# Patient Record
Sex: Female | Born: 1960 | Race: White | Hispanic: No | Marital: Married | Smoking: Never smoker
Health system: Southern US, Community
[De-identification: ages and names within clinical notes are randomized; demographics above are authoritative.]

## PROBLEM LIST (undated history)

## (undated) DIAGNOSIS — K5909 Other constipation: Secondary | ICD-10-CM

## (undated) HISTORY — PX: CLEFT PALATE REPAIR: SUR1165

## (undated) HISTORY — PX: COSMETIC SURGERY: SHX468

## (undated) HISTORY — DX: Other constipation: K59.09

---

## 2001-02-19 ENCOUNTER — Encounter: Admission: RE | Admit: 2001-02-19 | Discharge: 2001-02-19 | Payer: Self-pay | Admitting: Gastroenterology

## 2001-02-19 ENCOUNTER — Encounter: Payer: Self-pay | Admitting: Gastroenterology

## 2001-02-27 ENCOUNTER — Ambulatory Visit (HOSPITAL_COMMUNITY): Admission: RE | Admit: 2001-02-27 | Discharge: 2001-02-27 | Payer: Self-pay | Admitting: Gastroenterology

## 2001-02-27 ENCOUNTER — Encounter: Payer: Self-pay | Admitting: Gastroenterology

## 2003-08-16 ENCOUNTER — Encounter: Admission: RE | Admit: 2003-08-16 | Discharge: 2003-08-16 | Payer: Self-pay | Admitting: Gastroenterology

## 2003-08-18 ENCOUNTER — Encounter: Admission: RE | Admit: 2003-08-18 | Discharge: 2003-08-18 | Payer: Self-pay | Admitting: Gastroenterology

## 2003-09-28 ENCOUNTER — Ambulatory Visit (HOSPITAL_COMMUNITY): Admission: RE | Admit: 2003-09-28 | Discharge: 2003-09-28 | Payer: Self-pay | Admitting: *Deleted

## 2004-03-02 ENCOUNTER — Ambulatory Visit (HOSPITAL_COMMUNITY): Admission: RE | Admit: 2004-03-02 | Discharge: 2004-03-02 | Payer: Self-pay | Admitting: Gastroenterology

## 2004-07-04 ENCOUNTER — Other Ambulatory Visit: Admission: RE | Admit: 2004-07-04 | Discharge: 2004-07-04 | Payer: Self-pay | Admitting: Obstetrics and Gynecology

## 2004-07-25 ENCOUNTER — Ambulatory Visit (HOSPITAL_COMMUNITY): Admission: RE | Admit: 2004-07-25 | Discharge: 2004-07-25 | Payer: Self-pay | Admitting: Obstetrics and Gynecology

## 2004-11-06 ENCOUNTER — Encounter: Admission: RE | Admit: 2004-11-06 | Discharge: 2004-11-06 | Payer: Self-pay | Admitting: Orthopedic Surgery

## 2004-11-30 ENCOUNTER — Ambulatory Visit (HOSPITAL_COMMUNITY): Admission: RE | Admit: 2004-11-30 | Discharge: 2004-11-30 | Payer: Self-pay | Admitting: Gastroenterology

## 2004-12-07 ENCOUNTER — Encounter (INDEPENDENT_AMBULATORY_CARE_PROVIDER_SITE_OTHER): Payer: Self-pay | Admitting: Specialist

## 2004-12-07 ENCOUNTER — Ambulatory Visit (HOSPITAL_COMMUNITY): Admission: RE | Admit: 2004-12-07 | Discharge: 2004-12-07 | Payer: Self-pay | Admitting: Gastroenterology

## 2005-12-05 ENCOUNTER — Other Ambulatory Visit: Admission: RE | Admit: 2005-12-05 | Discharge: 2005-12-05 | Payer: Self-pay | Admitting: Obstetrics and Gynecology

## 2006-12-19 ENCOUNTER — Ambulatory Visit (HOSPITAL_COMMUNITY): Admission: RE | Admit: 2006-12-19 | Discharge: 2006-12-19 | Payer: Self-pay | Admitting: Family Medicine

## 2007-02-11 ENCOUNTER — Other Ambulatory Visit: Admission: RE | Admit: 2007-02-11 | Discharge: 2007-02-11 | Payer: Self-pay | Admitting: Obstetrics and Gynecology

## 2008-02-27 ENCOUNTER — Other Ambulatory Visit: Admission: RE | Admit: 2008-02-27 | Discharge: 2008-02-27 | Payer: Self-pay | Admitting: Obstetrics and Gynecology

## 2008-03-01 ENCOUNTER — Ambulatory Visit (HOSPITAL_COMMUNITY): Admission: RE | Admit: 2008-03-01 | Discharge: 2008-03-01 | Payer: Self-pay | Admitting: Obstetrics and Gynecology

## 2009-03-10 ENCOUNTER — Ambulatory Visit (HOSPITAL_COMMUNITY): Admission: RE | Admit: 2009-03-10 | Discharge: 2009-03-10 | Payer: Self-pay | Admitting: Obstetrics and Gynecology

## 2010-08-20 ENCOUNTER — Encounter: Payer: Self-pay | Admitting: Obstetrics and Gynecology

## 2010-12-15 NOTE — Op Note (Signed)
Angelica Warren, Angelica Warren                            ACCOUNT NO.:  [id number]   MEDICAL RECORD NO.:  [id number]                   PATIENT TYPE:  AMB   LOCATION:  ENDO                                 FACILITY:  [facility]   PHYSICIAN:  Althea Grimmer. Luther Parody, M.D.            DATE OF BIRTH:  1961/05/16   DATE OF PROCEDURE:  09/28/2003  DATE OF DISCHARGE:  09/28/2003                                 OPERATIVE REPORT   PROCEDURE:  Anorectal manometry.   INDICATIONS:  Constipation and difficult defecation.   DESCRIPTION OF PROCEDURE:  The anorectal manometry exam was performed in  standard fashion.  Initial rectal exam did not reveal any pathology.  The  anorectal catheter was inserted and measurements taken.  On pull-through, at  rest, and with sphincter contraction, sensation compliance and ease of  defecation were measured with the balloon inflated as described below.  The  length of the anal sphincter was from approximately 0.5-4.5 cm or 4 cm total  in length which is normal.  The resting internal sphincter pressure was  maximal at 1 cm from the anal verge at 47.5 mmHg which is on the low side of  normal but still within normal limits.  The maximal voluntary contraction  pressure reflective of the external sphincter was 96.4 mmHg which is again,  on the low side of normal but within normal limits.  The patient maintained  a maximal contraction for 52.6 seconds which was normal.  Ecterograms  demonstrated fairly symmetric pressures in the anal canal with mild decrease  anteriorly at 2-4 cm from the anal verge.  The rectoanal inhibitory reflex  was present.  Her first threshold of sensation was 20 mL in the rectal  balloon which is the lowest border of normality.  Her maximal tolerable  volume was 185 mL which should be 200 mL.  Her rectum is slightly more  compliant than usual which possibly could impede defecation.  This, however,  is hard to correlate with her lack of tolerating a larger volume in  the  rectum.  She was able to expel a 50 mL balloon with minimal traction.   IMPRESSION:  Near normal anorectal manometry with borderline low sphincter  pressures, normal sensation, normal sphincter length, and normal rectoanal  inhibitory reflex.  These do not identify any cause for chronic constipation  in this patient.                                               Althea Grimmer. Luther Parody, M.D.    PJS/MEDQ  D:  10/08/2003  T:  10/10/2003  Job:  [id number]   cc:   Bernette Redbird, M.D.  [address]  [place], [place] [address]  Fax: [phone]

## 2010-12-15 NOTE — Op Note (Signed)
NAME:  Angelica Warren, Angelica Warren                            ACCOUNT NO.:  [id number]   MEDICAL RECORD NO.:  [id number]                   PATIENT TYPE:  AMB   LOCATION:  ENDO                                 FACILITY:  [facility]   PHYSICIAN:  Bernette Redbird, M.D.                DATE OF BIRTH:  1961-05-06   DATE OF PROCEDURE:  03/02/2004  DATE OF DISCHARGE:                                 OPERATIVE REPORT   PROCEDURE PERFORMED:  Colonoscopy.   ENDOSCOPIST:  Florencia Reasons, M.D.   INDICATIONS FOR PROCEDURE:  Constipation and bloating in a 50 year old  female with a history of periodic abdominal pain thought to possibly be of  biliary tract origin.   FINDINGS:  Normal exam to the terminal ileum.   DESCRIPTION OF PROCEDURE:  The nature, purpose and risks of the procedure  had been discussed with the patient, who provided written consent.  Sedation  was fentanyl 60 mcg and Versed 6 mg IV without arrhythmias or desaturation.   The Olympus adjustable tension pediatric video colonoscope was advanced  without significant difficulty, just some looping, to the terminal ileum  which had a normal appearance and pullback was then performed.  The quality  of the prep was excellent and it is felt that all areas were well seen.  This was a normal examination.  No polyps, cancer, colitis, vascular  malformations, diverticular disease or colonic strictures were observed.  Retroflexion could not be accomplished in the rectum due to a small rectal  ampulla but reinspection of the rectum and careful antegrade viewing of the  distal rectum disclosed no abnormalities.  There were perhaps mild to  moderate internal hemorrhoids present but nothing particularly impressive.  No biopsies were obtained.   The patient tolerated the procedure well and there were no apparent  complications.   IMPRESSION:  No source of constipation colonoscopically evident.  Normal  exam [phone]).   PLAN:  Clinical follow-up.                                       Bernette Redbird, M.D.    RB/MEDQ  D:  03/02/2004  T:  03/03/2004  Job:  [id number]   cc:   Duncan Dull, M.D.  [address]  [place]  [place] [address]  Fax: [phone]

## 2010-12-15 NOTE — Op Note (Signed)
[name], Angelica Warren                  ACCOUNT NO.:  [id number]   MEDICAL RECORD NO.:  [id number]          PATIENT TYPE:  AMB   LOCATION:  ENDO                         FACILITY:  [facility]   PHYSICIAN:  Bernette Redbird, M.D.   DATE OF BIRTH:  [birth year]   DATE OF PROCEDURE:  12/07/2004  DATE OF DISCHARGE:                                 OPERATIVE REPORT   PROCEDURE:  Upper endoscopy with biopsies.   ENDOSCOPIST:  Bernette Redbird, M.D.   INDICATIONS:  [age]-year-old female with nonspecific upper abdominal  symptoms, being considered for possible empiric cholecystectomy.  She has  had some degree of symptomatic improvement on empiric ursodeoxycholic acid  therapy.   FINDINGS:  Normal exam.   PROCEDURE:  The nature, purpose and  risks of the procedure been discussed  with the patient, who provided written consent.  Sedation was fentanyl 50  mcg and Versed 6 mg IV without arrhythmias or desaturation.  The Olympus  small-caliber adult video endoscope was passed under direct vision.  The  vocal cords were not well-seen, but no gross laryngeal abnormalities were  appreciated.  The esophagus was entered and was endoscopically normal  without evidence of reflux esophagitis, Barrett's esophagus, varices,  infection, neoplasia, free reflux or any ring, stricture or hiatal hernia.  The stomach contained no significant residual and had normal mucosa without  evidence of gastritis, erosions, ulcers, polyps or masses including a  retroflexed view of the cardia.  The pylorus, duodenal bulb and second  duodenum looked normal.  Random duodenal and antral mucosal biopsies were  obtained prior to removal of the scope.  The patient tolerated the procedure  well and there were no apparent complications.   IMPRESSION:  Upper abdominal pain and nonspecific discomfort without  worrisome findings on current examination.   PLAN:  Await pathology results.      RB/MEDQ  D:  12/07/2004  T:  12/07/2004  Job:   [id number]   cc:   Duncan Dull, M.D.  [address]  [place]  [place] [address]  Fax: [phone]   Mikey Bussing  [address]  [place]  [place] [address]  Fax: [phone]

## 2011-10-29 ENCOUNTER — Other Ambulatory Visit: Payer: Self-pay | Admitting: Obstetrics and Gynecology

## 2011-10-29 DIAGNOSIS — Z1231 Encounter for screening mammogram for malignant neoplasm of breast: Secondary | ICD-10-CM

## 2011-11-01 ENCOUNTER — Ambulatory Visit
Admission: RE | Admit: 2011-11-01 | Discharge: 2011-11-01 | Disposition: A | Discharge status: Home / Self Care | Payer: BC Managed Care – PPO | Source: Ambulatory Visit | Attending: Obstetrics and Gynecology | Admitting: Obstetrics and Gynecology

## 2011-11-01 DIAGNOSIS — Z1231 Encounter for screening mammogram for malignant neoplasm of breast: Secondary | ICD-10-CM

## 2011-11-02 ENCOUNTER — Other Ambulatory Visit: Payer: Self-pay | Admitting: Obstetrics and Gynecology

## 2011-11-02 DIAGNOSIS — R928 Other abnormal and inconclusive findings on diagnostic imaging of breast: Secondary | ICD-10-CM

## 2011-11-16 ENCOUNTER — Other Ambulatory Visit: Payer: BC Managed Care – PPO

## 2011-11-16 ENCOUNTER — Ambulatory Visit
Admission: RE | Admit: 2011-11-16 | Discharge: 2011-11-16 | Disposition: A | Discharge status: Home / Self Care | Payer: BC Managed Care – PPO | Source: Ambulatory Visit | Attending: Obstetrics and Gynecology | Admitting: Obstetrics and Gynecology

## 2011-11-16 DIAGNOSIS — R928 Other abnormal and inconclusive findings on diagnostic imaging of breast: Secondary | ICD-10-CM

## 2012-02-19 LAB — HM PAP SMEAR: HM Pap smear: NEGATIVE

## 2013-02-13 ENCOUNTER — Encounter: Payer: Self-pay | Admitting: *Deleted

## 2013-02-19 ENCOUNTER — Encounter: Payer: Self-pay | Admitting: Nurse Practitioner

## 2013-02-19 ENCOUNTER — Ambulatory Visit (INDEPENDENT_AMBULATORY_CARE_PROVIDER_SITE_OTHER): Payer: BC Managed Care – PPO | Admitting: Nurse Practitioner

## 2013-02-19 ENCOUNTER — Other Ambulatory Visit: Payer: Self-pay

## 2013-02-19 VITALS — BP 106/50 | HR 66 | Resp 14 | Ht 65.75 in | Wt 134.2 lb

## 2013-02-19 DIAGNOSIS — Z01419 Encounter for gynecological examination (general) (routine) without abnormal findings: Secondary | ICD-10-CM

## 2013-02-19 DIAGNOSIS — Z0000 Encounter for general adult medical examination without abnormal findings: Secondary | ICD-10-CM

## 2013-02-19 DIAGNOSIS — E559 Vitamin D deficiency, unspecified: Secondary | ICD-10-CM

## 2013-02-19 DIAGNOSIS — Z1231 Encounter for screening mammogram for malignant neoplasm of breast: Secondary | ICD-10-CM

## 2013-02-19 LAB — POCT URINALYSIS DIPSTICK
Leukocytes, UA: NEGATIVE
Spec Grav, UA: 1.015
Urobilinogen, UA: NEGATIVE
pH, UA: 6

## 2013-02-19 LAB — HEMOGLOBIN, FINGERSTICK: Hemoglobin, fingerstick: 13 g/dL (ref 12.0–16.0)

## 2013-02-19 NOTE — Progress Notes (Signed)
52 y.o. G0P0 Married  Fe here for annual exam.  She got married 10/23/09 and tried for a pregnancy then but already perimenopausal with irregular cycles. After her father died 2010-09-04 her cycles became more irregular.  She took Provera challenge last July and had spotting 5 days light to dark brownish to black.  Then no menses until May 2014 - lasted 3-4 days normal to light flow. Some breast tendreness. Some increase in night flushes that are tolerable this past month. Some increase in vaginal dryness. No vaginal bleeding since first week of May.  Patient's last menstrual period was 12/03/2012.          Sexually active: yes  The current method of family planning is none.    Exercising: no  The patient does not participate in regular exercise at present. Smoker:  no  Health Maintenance: Pap:  02/19/2012  Normal with negative HR HPV MMG:  11/03/2011 scheduled for 02/20/13 Colonoscopy:  12/2012 normal and recheck in 10 years BMD:   never TDaP:  01/2008 Labs: Hgb- 13.0    reports that she has never smoked. She has never used smokeless tobacco. She reports that she does not drink alcohol or use illicit drugs.  Past Medical History  Diagnosis Date  . Chronic constipation     Past Surgical History  Procedure Laterality Date  . Cleft palate repair  infant  . Cosmetic surgery Left     hand    Current Outpatient Prescriptions  Medication Sig Dispense Refill  . Ascorbic Acid (VITAMIN C) 100 MG tablet Take 100 mg by mouth daily.      Marland Kitchen aspirin 325 MG buffered tablet Take 325 mg by mouth daily.      . Digestive Enzymes (SIMILASE PO) Take by mouth.      . fish oil-omega-3 fatty acids 1000 MG capsule Take 2 g by mouth daily.      . milk thistle 175 MG tablet Take 175 mg by mouth daily.      . Multiple Vitamin (MULTIVITAMIN) tablet Take 1 tablet by mouth daily.      Marland Kitchen PROBIOTIC CAPS Take 1 capsule by mouth daily.      . Vitamin D, Ergocalciferol, (DRISDOL) 50000 UNITS CAPS Take 50,000 Units by mouth  every 7 (seven) days.       No current facility-administered medications for this visit.    Family History  Problem Relation Age of Onset  . Throat cancer Father     throat cancer  . Thyroid disease Father   . Hypertension Father   . Hyperlipidemia Father   . Thyroid disease Maternal Aunt   . Osteoporosis Maternal Aunt   . Lung cancer Maternal Uncle     lung cancer  . Heart disease Maternal Uncle   . Lung cancer Paternal Uncle     lung cancer  . Thyroid disease Maternal Grandmother   . Osteoporosis Maternal Grandmother   . Heart disease Maternal Grandfather   . Cancer Paternal Grandmother     ROS:  Pertinent items are noted in HPI.  Otherwise, a comprehensive ROS was negative.  Exam:   BP 106/50  Pulse 66  Resp 14  Ht 5' 5.75" (1.67 m)  Wt 134 lb 3.2 oz (60.873 kg)  BMI 21.83 kg/m2  LMP 12/03/2012 Height: 5' 5.75" (167 cm)  Ht Readings from Last 3 Encounters:  02/19/13 5' 5.75" (1.67 m)    General appearance: alert, cooperative and appears stated age Head: Normocephalic, without obvious abnormality, atraumatic Neck:  no adenopathy, supple, symmetrical, trachea midline and thyroid normal to inspection and palpation Lungs: clear to auscultation bilaterally Breasts: normal appearance, no masses or tenderness Heart: regular rate and rhythm Abdomen: soft, non-tender; no masses,  no organomegaly Extremities: extremities normal, atraumatic, no cyanosis or edema Skin: Skin color, texture, turgor normal. No rashes or lesions Lymph nodes: Cervical, supraclavicular, and axillary nodes normal. No abnormal inguinal nodes palpated Neurologic: Grossly normal   Pelvic: External genitalia:  no lesions              Urethra:  normal appearing urethra with no masses, tenderness or lesions              Bartholin's and Skene's: normal                 Vagina: normal appearing vagina with normal color and discharge, no lesions              Cervix: anteverted              Pap taken:  no Bimanual Exam:  Uterus:  normal size, contour, position, consistency, mobility, non-tender              Adnexa: no mass, fullness, tenderness               Rectovaginal: Confirms               Anus:  normal sphincter tone, no lesions  A:  Well Woman with normal exam  Perimenopausal with irregular menses  Vit D deficiency  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  Will repeat Provera challenge in late August if amenorrhea then follow with [facility] if indicated. return annually or prn  An After Visit Summary was printed and given to the patient.

## 2013-02-19 NOTE — Patient Instructions (Addendum)

## 2013-02-20 ENCOUNTER — Ambulatory Visit (HOSPITAL_COMMUNITY)
Admission: RE | Admit: 2013-02-20 | Discharge: 2013-02-20 | Disposition: A | Discharge status: Home / Self Care | Payer: BC Managed Care – PPO | Source: Ambulatory Visit | Attending: Obstetrics and Gynecology | Admitting: Obstetrics and Gynecology

## 2013-02-20 DIAGNOSIS — Z1231 Encounter for screening mammogram for malignant neoplasm of breast: Secondary | ICD-10-CM

## 2013-02-20 LAB — VITAMIN D 25 HYDROXY (VIT D DEFICIENCY, FRACTURES): Vit D, 25-Hydroxy: 66 ng/mL (ref 30–89)

## 2013-02-22 NOTE — Progress Notes (Signed)
Encounter reviewed by Dr. Brook Silva.  

## 2013-02-24 ENCOUNTER — Telehealth: Payer: Self-pay | Admitting: *Deleted

## 2013-02-24 NOTE — Telephone Encounter (Signed)
Message copied by Osie Bond on Tue Feb 24, 2013  2:23 PM ------      Message from: Ria Comment R      Created: Mon Feb 23, 2013  6:44 PM       Follow protocol ------

## 2013-02-24 NOTE — Telephone Encounter (Signed)
Pt is aware of Vitamin D lab results and is agreeable to begin Vitamin D (otc) 600-800 IU  1 po qd.

## 2013-03-06 ENCOUNTER — Ambulatory Visit: Payer: BC Managed Care – PPO

## 2014-02-22 ENCOUNTER — Encounter: Payer: Self-pay | Admitting: Nurse Practitioner

## 2014-02-22 ENCOUNTER — Ambulatory Visit (INDEPENDENT_AMBULATORY_CARE_PROVIDER_SITE_OTHER): Payer: BC Managed Care – PPO | Admitting: Nurse Practitioner

## 2014-02-22 VITALS — BP 116/60 | HR 60 | Ht 65.75 in | Wt 135.0 lb

## 2014-02-22 DIAGNOSIS — Z01419 Encounter for gynecological examination (general) (routine) without abnormal findings: Secondary | ICD-10-CM

## 2014-02-22 DIAGNOSIS — N951 Menopausal and female climacteric states: Secondary | ICD-10-CM

## 2014-02-22 DIAGNOSIS — Z0000 Encounter for general adult medical examination without abnormal findings: Secondary | ICD-10-CM

## 2014-02-22 DIAGNOSIS — R319 Hematuria, unspecified: Secondary | ICD-10-CM

## 2014-02-22 LAB — POCT URINALYSIS DIPSTICK
Bilirubin, UA: NEGATIVE
Glucose, UA: NEGATIVE
Ketones, UA: NEGATIVE
Leukocytes, UA: NEGATIVE
Nitrite, UA: NEGATIVE
Protein, UA: NEGATIVE
Urobilinogen, UA: NEGATIVE
pH, UA: 6

## 2014-02-22 LAB — HEMOGLOBIN, FINGERSTICK: Hemoglobin, fingerstick: 12.1 g/dL (ref 12.0–16.0)

## 2014-02-22 NOTE — Progress Notes (Signed)
53 y.o. G0P0 Married Caucasian Fe here for annual exam.  No vaginal bleeding since October 2014.  Did not take Provera challenge.  But also did not have usual menstrual symptoms prior to starting.  3-4 days moderate to light. PMP 11/2012 without Provera challenge.  No Provera since July 2013.  She is having an increase in vaso symptoms.  Patient's last menstrual period was 04/29/2013.          Sexually active: Yes.    The current method of family planning is none.    Exercising: No.  The patient does not participate in regular exercise at present. Smoker:  no  Health Maintenance: Pap:  02/19/12 Normal with Negative HR HPV MMG:  02/23/13 BI-RADS Neg Colonoscopy: 6/14 normal and recheck in 10 years  TDaP:  01/2008  Labs: Hgb: 12.1   Urine: RBC trace   reports that she has never smoked. She has never used smokeless tobacco. She reports that she does not drink alcohol or use illicit drugs.  Past Medical History  Diagnosis Date  . Chronic constipation     Past Surgical History  Procedure Laterality Date  . Cleft palate repair  infant  . Cosmetic surgery Left     hand    Current Outpatient Prescriptions  Medication Sig Dispense Refill  . Ascorbic Acid (VITAMIN C) 100 MG tablet Take 100 mg by mouth daily.      Marland Kitchen aspirin 325 MG buffered tablet Take 325 mg by mouth daily.      . Digestive Enzymes (SIMILASE PO) Take by mouth.      . fish oil-omega-3 fatty acids 1000 MG capsule Take 2 g by mouth daily.      . milk thistle 175 MG tablet Take 175 mg by mouth daily.      . Misc Natural Products (SUPER GREENS) POWD Take by mouth.      . Multiple Vitamin (MULTIVITAMIN) tablet Take 1 tablet by mouth daily.      Marland Kitchen PROBIOTIC CAPS Take 1 capsule by mouth daily.      . Vitamin D, Ergocalciferol, (DRISDOL) 50000 UNITS CAPS Take 50,000 Units by mouth every 7 (seven) days.       No current facility-administered medications for this visit.    Family History  Problem Relation Age of Onset  . Throat  cancer Father     throat cancer  . Thyroid disease Father   . Hypertension Father   . Hyperlipidemia Father   . Thyroid disease Maternal Aunt   . Osteoporosis Maternal Aunt   . Lung cancer Maternal Uncle     lung cancer  . Heart disease Maternal Uncle   . Lung cancer Paternal Uncle     lung cancer  . Thyroid disease Maternal Grandmother   . Osteoporosis Maternal Grandmother   . Heart disease Maternal Grandfather   . Cancer Paternal Grandmother     ROS:  Pertinent items are noted in HPI.  Otherwise, a comprehensive ROS was negative.  Exam:   BP 116/60  Pulse 60  Ht 5' 5.75" (1.67 m)  Wt 135 lb (61.236 kg)  BMI 21.96 kg/m2  LMP 04/29/2013 Height: 5' 5.75" (167 cm)  Ht Readings from Last 3 Encounters:  02/22/14 5' 5.75" (1.67 m)  02/19/13 5' 5.75" (1.67 m)    General appearance: alert, cooperative and appears stated age Head: Normocephalic, without obvious abnormality, atraumatic Neck: no adenopathy, supple, symmetrical, trachea midline and thyroid normal to inspection and palpation Lungs: clear to auscultation bilaterally Breasts:  normal appearance, no masses or tenderness Heart: regular rate and rhythm Abdomen: soft, non-tender; no masses,  no organomegaly Extremities: extremities normal, atraumatic, no cyanosis or edema Skin: Skin color, texture, turgor normal. No rashes or lesions Lymph nodes: Cervical, supraclavicular, and axillary nodes normal. No abnormal inguinal nodes palpated Neurologic: Grossly normal   Pelvic: External genitalia:  no lesions              Urethra:  normal appearing urethra with no masses, tenderness or lesions              Bartholin's and Skene's: normal                 Vagina: normal appearing vagina with normal color and discharge, no lesions              Cervix: anteverted              Pap taken: No. Bimanual Exam:  Uterus:  normal size, contour, position, consistency, mobility, non-tender              Adnexa: no mass, fullness,  tenderness               Rectovaginal: Confirms               Anus:  normal sphincter tone, no lesions  A:  Well Woman with normal exam  Perimenopausal  Increase in vaso symptoms.  R/O UTI  P:   Reviewed health and wellness pertinent to exam  Pap smear not taken today  Mammogram is due 7/15  Follow with urine culture and micro  Follow with future labs  If she does not do well with natural products for vaso symptoms may need HRT - she will need to return for consult visit - she is in agreement with plan.  Counseled on breast self exam, mammography screening, adequate intake of calcium and vitamin D, diet and exercise, Kegel's exercises return annually or prn  An After Visit Summary was printed and given to the patient.

## 2014-02-22 NOTE — Patient Instructions (Signed)

## 2014-02-23 ENCOUNTER — Other Ambulatory Visit: Payer: Self-pay | Admitting: Nurse Practitioner

## 2014-02-23 DIAGNOSIS — Z1231 Encounter for screening mammogram for malignant neoplasm of breast: Secondary | ICD-10-CM

## 2014-02-23 LAB — URINE CULTURE
Colony Count: NO GROWTH
Organism ID, Bacteria: NO GROWTH

## 2014-02-23 LAB — URINALYSIS, MICROSCOPIC ONLY
Bacteria, UA: NONE SEEN
Casts: NONE SEEN
Crystals: NONE SEEN
Squamous Epithelial / LPF: NONE SEEN

## 2014-02-23 LAB — FOLLICLE STIMULATING HORMONE: FSH: 99.4 m[IU]/mL

## 2014-02-24 ENCOUNTER — Ambulatory Visit (HOSPITAL_COMMUNITY)
Admission: RE | Admit: 2014-02-24 | Discharge: 2014-02-24 | Disposition: A | Discharge status: Home / Self Care | Payer: BC Managed Care – PPO | Source: Ambulatory Visit | Attending: Nurse Practitioner | Admitting: Nurse Practitioner

## 2014-02-24 DIAGNOSIS — Z1231 Encounter for screening mammogram for malignant neoplasm of breast: Secondary | ICD-10-CM | POA: Insufficient documentation

## 2014-02-25 ENCOUNTER — Other Ambulatory Visit (INDEPENDENT_AMBULATORY_CARE_PROVIDER_SITE_OTHER): Payer: BC Managed Care – PPO

## 2014-02-25 ENCOUNTER — Telehealth: Payer: Self-pay | Admitting: *Deleted

## 2014-02-25 DIAGNOSIS — Z0000 Encounter for general adult medical examination without abnormal findings: Secondary | ICD-10-CM

## 2014-02-25 LAB — LIPID PANEL
Cholesterol: 169 mg/dL (ref 0–200)
HDL: 76 mg/dL (ref >39)
LDL Cholesterol: 81 mg/dL (ref 0–99)
Total CHOL/HDL Ratio: 2.2 Ratio
Triglycerides: 60 mg/dL (ref <150)
VLDL: 12 mg/dL (ref 0–40)

## 2014-02-25 LAB — COMPREHENSIVE METABOLIC PANEL
ALT: 18 U/L (ref 0–35)
AST: 23 U/L (ref 0–37)
Albumin: 4.2 g/dL (ref 3.5–5.2)
Alkaline Phosphatase: 69 U/L (ref 39–117)
BUN: 15 mg/dL (ref 6–23)
CO2: 28 mEq/L (ref 19–32)
Calcium: 9.5 mg/dL (ref 8.4–10.5)
Chloride: 104 mEq/L (ref 96–112)
Creat: 0.77 mg/dL (ref 0.50–1.10)
Glucose, Bld: 84 mg/dL (ref 70–99)
Potassium: 4.3 mEq/L (ref 3.5–5.3)
Sodium: 141 mEq/L (ref 135–145)
Total Bilirubin: 0.5 mg/dL (ref 0.2–1.2)
Total Protein: 6.8 g/dL (ref 6.0–8.3)

## 2014-02-25 NOTE — Telephone Encounter (Signed)
Pt notified of results.  Pt voices understanding and is agreeable with plan.  Pt had labs drawn earlier today.  Advised those results not available, but we will call her when results are available.

## 2014-02-25 NOTE — Telephone Encounter (Signed)
Message copied by Graylon Good on Thu Feb 25, 2014  3:42 PM ------      Message from: GRUBB, PATRICIA R      Created: Wed Feb 24, 2014  6:15 PM       Let patient know that urine micro and culture is negative. Her [place] does show menopause.  Have her to try OTC herbal products for vaso symptoms - if not helpful may need HRT. ------

## 2014-02-25 NOTE — Progress Notes (Signed)
Encounter reviewed by Dr. Brook Silva.  

## 2014-02-25 NOTE — Telephone Encounter (Signed)
I have attempted to contact this patient by phone with the following results: I will continue to try later.  Pt unavailable and mailbox full. [phone] (Mobile)

## 2014-02-26 LAB — VITAMIN D 25 HYDROXY (VIT D DEFICIENCY, FRACTURES): Vit D, 25-Hydroxy: 63 ng/mL (ref 30–89)

## 2014-02-26 LAB — TSH: TSH: 0.75 u[IU]/mL (ref 0.350–4.500)

## 2014-11-11 ENCOUNTER — Encounter: Payer: Self-pay | Admitting: Nurse Practitioner

## 2014-11-11 ENCOUNTER — Telehealth: Payer: Self-pay | Admitting: Nurse Practitioner

## 2014-11-11 NOTE — Telephone Encounter (Signed)
I was unable to reach patient by phone, mailed letter.

## 2015-02-24 ENCOUNTER — Ambulatory Visit: Payer: BC Managed Care – PPO | Admitting: Nurse Practitioner

## 2015-02-25 ENCOUNTER — Encounter: Payer: Self-pay | Admitting: Nurse Practitioner

## 2015-02-25 ENCOUNTER — Ambulatory Visit (INDEPENDENT_AMBULATORY_CARE_PROVIDER_SITE_OTHER): Payer: BC Managed Care – PPO | Admitting: Nurse Practitioner

## 2015-02-25 VITALS — BP 104/60 | HR 72 | Ht 65.5 in | Wt 134.4 lb

## 2015-02-25 DIAGNOSIS — R8290 Unspecified abnormal findings in urine: Secondary | ICD-10-CM | POA: Diagnosis not present

## 2015-02-25 DIAGNOSIS — Z0000 Encounter for general adult medical examination without abnormal findings: Secondary | ICD-10-CM | POA: Diagnosis not present

## 2015-02-25 DIAGNOSIS — Z01419 Encounter for gynecological examination (general) (routine) without abnormal findings: Secondary | ICD-10-CM

## 2015-02-25 DIAGNOSIS — N950 Postmenopausal bleeding: Secondary | ICD-10-CM | POA: Diagnosis not present

## 2015-02-25 DIAGNOSIS — N912 Amenorrhea, unspecified: Secondary | ICD-10-CM | POA: Diagnosis not present

## 2015-02-25 LAB — POCT URINALYSIS DIPSTICK
Leukocytes, UA: NEGATIVE
Urobilinogen, UA: NEGATIVE
pH, UA: 5

## 2015-02-25 LAB — TSH: TSH: 0.858 u[IU]/mL (ref 0.350–4.500)

## 2015-02-25 NOTE — Patient Instructions (Signed)

## 2015-02-25 NOTE — Progress Notes (Signed)
54 y.o. G0P0 Married  Caucasian Fe here for annual exam.  PMP 04/29/2013.  She then had another light 2 day spotting 11/28/2014.  She was unsure how long between the last menses.  Per last year history it has been over 1 year.  No associated PMS, vaso symptoms are tolerable.  The last [facility] 01/2014 was 99.4.  She has had no other PMS symptoms, bloating, or breast tenderness. She does have an increase in vaginal dryness. She is also having an increase in rectocele problems especially with SA. Stool in the vault is causing increase pain with insertion into the vagina.  Patient's last menstrual period was 11/28/2014.          Sexually active: Yes.    The current method of family planning is post menopausal status.    Exercising: No.  The patient does not participate in regular exercise at present. Smoker:  no  Health Maintenance: Pap:  02/19/12 wnl MMG:  02/24/14 bi-rads 1: negative Colonoscopy:  12/2012 normal f/u 2024 BMD:   Never had one TDaP:  2009 Labs: Hgb:     ; Urine: Trace rbc's - no symptoms   reports that she has never smoked. She has never used smokeless tobacco. She reports that she does not drink alcohol or use illicit drugs.  Past Medical History  Diagnosis Date  . Chronic constipation     Past Surgical History  Procedure Laterality Date  . Cleft palate repair  infant  . Cosmetic surgery Left     hand    Current Outpatient Prescriptions  Medication Sig Dispense Refill  . Ascorbic Acid (VITAMIN C) 100 MG tablet Take 100 mg by mouth daily.    Marland Kitchen aspirin 325 MG buffered tablet Take 325 mg by mouth as needed.     . B Complex Vitamins (VITAMIN B COMPLEX PO) Take 1 capsule by mouth daily.    . Calcium-Magnesium-Vitamin D (CALCIUM MAGNESIUM PO) Take by mouth. 1-2 caps daily    . Digestive Enzymes (SIMILASE PO) Take by mouth.    . fish oil-omega-3 fatty acids 1000 MG capsule Take 2 g by mouth daily.    . Misc Natural Products (SUPER GREENS) POWD Take by mouth.    . Multiple Vitamin  (MULTIVITAMIN) tablet Take 1 tablet by mouth daily.    Marland Kitchen OVER THE COUNTER MEDICATION daily. Thyroid Support 1-2 capsules    . OVER THE COUNTER MEDICATION daily. Adrenal Support 1-2 caps    . OVER THE COUNTER MEDICATION daily. Liver Support 1-2 capsules    . PROBIOTIC CAPS Take 1 capsule by mouth daily.    . Vitamin D, Ergocalciferol, (DRISDOL) 50000 UNITS CAPS Take 50,000 Units by mouth every 7 (seven) days.     No current facility-administered medications for this visit.    Family History  Problem Relation Age of Onset  . Throat cancer Father     throat cancer  . Thyroid disease Father   . Hypertension Father   . Hyperlipidemia Father   . Thyroid disease Maternal Aunt   . Osteoporosis Maternal Aunt   . Lung cancer Maternal Uncle     lung cancer  . Heart disease Maternal Uncle   . Lung cancer Paternal Uncle     lung cancer  . Thyroid disease Maternal Grandmother   . Osteoporosis Maternal Grandmother   . Heart disease Maternal Grandfather   . Cancer Paternal Grandmother     ROS:  Pertinent items are noted in HPI.  Otherwise, a comprehensive ROS was  negative.  Exam:   BP 104/60 mmHg  Pulse 72  Ht 5' 5.5" (1.664 m)  Wt 134 lb 6.4 oz (60.963 kg)  BMI 22.02 kg/m2  LMP 11/28/2014 Height: 5' 5.5" (166.4 cm) Ht Readings from Last 3 Encounters:  02/25/15 5' 5.5" (1.664 m)  02/22/14 5' 5.75" (1.67 m)  02/19/13 5' 5.75" (1.67 m)    General appearance: alert, cooperative and appears stated age Head: Normocephalic, without obvious abnormality, atraumatic Neck: no adenopathy, supple, symmetrical, trachea midline and thyroid normal to inspection and palpation Lungs: clear to auscultation bilaterally Breasts: normal appearance, no masses or tenderness Heart: regular rate and rhythm Abdomen: soft, non-tender; no masses,  no organomegaly Extremities: extremities normal, atraumatic, no cyanosis or edema Skin: Skin color, texture, turgor normal. No rashes or lesions Lymph nodes:  Cervical, supraclavicular, and axillary nodes normal. No abnormal inguinal nodes palpated Neurologic: Grossly normal   Pelvic: External genitalia:  no lesions              Urethra:  normal appearing urethra with no masses, tenderness or lesions              Bartholin's and Skene's: normal                 Vagina: normal appearing vagina with normal color and discharge, no lesions              Cervix: anteverted              Pap taken: Yes.   Bimanual Exam:  Uterus:  normal size, contour, position, consistency, mobility, non-tender              Adnexa: no mass, fullness, tenderness               Rectovaginal: Confirms rectocele without stool in the vault               Anus:  normal sphincter tone, no lesions  Chaperone present: Yes  A:  Well Woman with normal exam  Perimenopausal vs. menopausal Increase in vaso symptoms. R/O UTI  PMB 11/2014  Rectocele   P:   Reviewed health and wellness pertinent to exam  Pap smear as above  Mammogram is due now and is scheduled  Will get PUS and endo biopsy if needed and follow, may re look at rectocele issues later and see if any surgical treatment is needed.  Will follow with labs and urine  Counseled on breast self exam, mammography screening, adequate intake of calcium and vitamin D, diet and exercise return annually or prn  An After Visit Summary was printed and given to the patient.

## 2015-02-26 LAB — URINALYSIS, MICROSCOPIC ONLY
Bacteria, UA: NONE SEEN [HPF]
Casts: NONE SEEN [LPF]
Crystals: NONE SEEN [HPF]
RBC / HPF: NONE SEEN RBC/HPF (ref <=2)
Squamous Epithelial / LPF: NONE SEEN [HPF] (ref <=5)
WBC, UA: NONE SEEN WBC/HPF (ref <=5)
Yeast: NONE SEEN [HPF]

## 2015-02-26 LAB — FOLLICLE STIMULATING HORMONE: FSH: 117.5 m[IU]/mL — ABNORMAL HIGH

## 2015-02-26 LAB — URINE CULTURE
Colony Count: NO GROWTH
Organism ID, Bacteria: NO GROWTH

## 2015-02-26 LAB — PROLACTIN: Prolactin: 7.3 ng/mL

## 2015-02-26 NOTE — Progress Notes (Signed)
Encounter reviewed by Dr. Brook Amundson C. Silva.  

## 2015-03-02 ENCOUNTER — Telehealth: Payer: Self-pay | Admitting: Emergency Medicine

## 2015-03-02 DIAGNOSIS — N950 Postmenopausal bleeding: Secondary | ICD-10-CM

## 2015-03-02 LAB — IPS PAP TEST WITH HPV

## 2015-03-02 LAB — HEMOGLOBIN, FINGERSTICK: Hemoglobin, fingerstick: 12.3 g/dL (ref 12.0–16.0)

## 2015-03-02 NOTE — Telephone Encounter (Signed)
Message left to return call to Angelica Warren at [phone].    

## 2015-03-02 NOTE — Telephone Encounter (Signed)
Spoke with patient and message from Angelica Warren, [place] given. Patient is agreeable and had been scheduled for pelvic ultrasound  and Endometrial biopsy with Dr Quincy Simmonds for tomorrow. Routing to provider for final review. Patient agreeable to disposition. Will close encounter.

## 2015-03-02 NOTE — Telephone Encounter (Signed)
-----   Message from Kem Boroughs, [place] sent at 02/28/2015  7:50 AM EDT ----- Please let pt. Know that [facility] was much higher at 117.5 compared to last year at 99.4.  This means this past vaginal bleeding episode was most likely post menopausal bleeding and not because of ovulation.  We will need to proceed as planned with endo biopsy and PUS. When this is scheduled she will need Cytotec.  The remainder of labs with Prolactin, Thyroid, and urine micro and culture were all normal.

## 2015-03-03 ENCOUNTER — Encounter: Payer: Self-pay | Admitting: Obstetrics and Gynecology

## 2015-03-03 ENCOUNTER — Ambulatory Visit (INDEPENDENT_AMBULATORY_CARE_PROVIDER_SITE_OTHER): Payer: BC Managed Care – PPO | Admitting: Obstetrics and Gynecology

## 2015-03-03 ENCOUNTER — Ambulatory Visit (INDEPENDENT_AMBULATORY_CARE_PROVIDER_SITE_OTHER): Payer: BC Managed Care – PPO

## 2015-03-03 VITALS — BP 120/82 | HR 84 | Ht 65.5 in | Wt 134.0 lb

## 2015-03-03 DIAGNOSIS — N950 Postmenopausal bleeding: Secondary | ICD-10-CM

## 2015-03-03 DIAGNOSIS — N812 Incomplete uterovaginal prolapse: Secondary | ICD-10-CM | POA: Diagnosis not present

## 2015-03-03 NOTE — Progress Notes (Signed)
Subjective  54 y.o. G56P0  Caucasian female here for pelvic ultrasound for ultrasound for Postmenopausal bleeding.  LMP was 04/29/13.  Had bleeding 11/28/14 that was light spotting.  Seen for annual exam and had [place] which was 117.5 on 02/25/15.   Not on HRT.   Also has enterocele and rectocele. Strains to have bowel movements.  Uses stool softeners.  Some urinary incontinence.  Teacher and holds urine a lot.  Wear a pad.   Objective  Pelvic ultrasound images and report reviewed with patient.  Uterus - 0.81 mm intramural fibroid  EMS - 2.54 mm Ovaries - normal Free fluid - no        Pelvic exam - normal external genitalia  Urethra with slight abrasion to the right.  Speculum - no significant cystocele, first degree uterine prolapse, second degree enterocele/rectocele.   Assessment  Postmenopausal bleeding.  Small uterine fibroid.  Reassuring endometrium.  Incomplete uterovaginal prolapse.   Plan  Discussion of postmenopausal bleeding and uterine fibroid.  I do not believe that the fibroid is causing her bleeding.  No EMB performed.  Will do EMB if has recurrent bleeding.  Discussion of pelvic organ prolapse - etiologies and treatment options. ACOG handout on pelvic organ prolapse.   Patient will return for pessary fitting.   ____25___ minutes face to face time of which over 50% was spent in counseling.   After visit summary to patient.

## 2015-03-03 NOTE — Addendum Note (Signed)
Addended by: Michele Mcalpine on: 03/03/2015 08:11 AM   Modules accepted: Orders

## 2015-03-07 ENCOUNTER — Encounter: Payer: Self-pay | Admitting: Obstetrics and Gynecology

## 2015-03-07 ENCOUNTER — Ambulatory Visit (INDEPENDENT_AMBULATORY_CARE_PROVIDER_SITE_OTHER): Payer: BC Managed Care – PPO | Admitting: Obstetrics and Gynecology

## 2015-03-07 ENCOUNTER — Other Ambulatory Visit: Payer: Self-pay | Admitting: Obstetrics and Gynecology

## 2015-03-07 ENCOUNTER — Ambulatory Visit (HOSPITAL_COMMUNITY)
Admission: RE | Admit: 2015-03-07 | Discharge: 2015-03-07 | Disposition: A | Discharge status: Home / Self Care | Payer: BC Managed Care – PPO | Source: Ambulatory Visit | Attending: Obstetrics and Gynecology | Admitting: Obstetrics and Gynecology

## 2015-03-07 ENCOUNTER — Ambulatory Visit (HOSPITAL_COMMUNITY): Payer: BC Managed Care – PPO

## 2015-03-07 VITALS — BP 118/80 | HR 82 | Resp 14 | Wt 133.6 lb

## 2015-03-07 DIAGNOSIS — Z1231 Encounter for screening mammogram for malignant neoplasm of breast: Secondary | ICD-10-CM | POA: Insufficient documentation

## 2015-03-07 DIAGNOSIS — N812 Incomplete uterovaginal prolapse: Secondary | ICD-10-CM | POA: Diagnosis not present

## 2015-03-07 DIAGNOSIS — N3946 Mixed incontinence: Secondary | ICD-10-CM | POA: Diagnosis not present

## 2015-03-07 NOTE — Progress Notes (Signed)
GYNECOLOGY  VISIT   HPI: 54 y.o.   Married  Caucasian  female   G0P0 with Patient's last menstrual period was 04/29/2014.   here for Pessary Fitting. Some leakage of urine with cough, laugh, and sneeze.  Has general leakage as well.   GYNECOLOGIC HISTORY: Patient's last menstrual period was 04/29/2014. Contraception: none Menopausal hormone therapy: none Last mammogram: 02/24/14 Bi-rads C 1 neg.  Will need to schedule.   Last pap smear: 02/25/15 WNL, HR HPV neg        OB History    Gravida Para Term Preterm AB TAB SAB Ectopic Multiple Living   0                  There are no active problems to display for this patient.   Past Medical History  Diagnosis Date  . Chronic constipation     Past Surgical History  Procedure Laterality Date  . Cleft palate repair  infant  . Cosmetic surgery Left     hand    Current Outpatient Prescriptions  Medication Sig Dispense Refill  . Ascorbic Acid (VITAMIN C) 100 MG tablet Take 100 mg by mouth daily.    Marland Kitchen aspirin 325 MG buffered tablet Take 325 mg by mouth as needed.     . B Complex Vitamins (VITAMIN B COMPLEX PO) Take 1 capsule by mouth daily.    . Calcium-Magnesium-Vitamin D (CALCIUM MAGNESIUM PO) Take by mouth. 1-2 caps daily    . Digestive Enzymes (SIMILASE PO) Take by mouth.    . fish oil-omega-3 fatty acids 1000 MG capsule Take 2 g by mouth daily.    . Misc Natural Products (SUPER GREENS) POWD Take by mouth.    . Multiple Vitamin (MULTIVITAMIN) tablet Take 1 tablet by mouth daily.    Marland Kitchen OVER THE COUNTER MEDICATION daily. Thyroid Support 1-2 capsules    . OVER THE COUNTER MEDICATION daily. Adrenal Support 1-2 caps    . OVER THE COUNTER MEDICATION daily. Liver Support 1-2 capsules    . PROBIOTIC CAPS Take 1 capsule by mouth daily.    . Vitamin D, Ergocalciferol, (DRISDOL) 50000 UNITS CAPS Take 50,000 Units by mouth every 7 (seven) days.     No current facility-administered medications for this visit.     ALLERGIES: Augmentin;  Dairy aid; and Eggs or egg-derived products  Family History  Problem Relation Age of Onset  . Throat cancer Father     throat cancer  . Thyroid disease Father   . Hypertension Father   . Hyperlipidemia Father   . Thyroid disease Maternal Aunt   . Osteoporosis Maternal Aunt   . Lung cancer Maternal Uncle     lung cancer  . Heart disease Maternal Uncle   . Lung cancer Paternal Uncle     lung cancer  . Thyroid disease Maternal Grandmother   . Osteoporosis Maternal Grandmother   . Heart disease Maternal Grandfather   . Cancer Paternal Grandmother     History   Social History  . Marital Status: Married    Spouse Name: N/A  . Number of Children: N/A  . Years of Education: N/A   Occupational History  . Not on file.   Social History Main Topics  . Smoking status: Never Smoker   . Smokeless tobacco: Never Used  . Alcohol Use: No  . Drug Use: No  . Sexual Activity: Yes    Birth Control/ Protection: Post-menopausal   Other Topics Concern  . Not on file  Social History Narrative    ROS:  Pertinent items are noted in HPI.  PHYSICAL EXAMINATION:    LMP 11/28/2014    General appearance: alert, cooperative and appears stated age  Pelvic: External genitalia:  no lesions              Urethra:  normal appearing urethra with no masses, tenderness or lesions              Bimanual Exam:  Uterus:  normal size, contour, position, consistency, mobility, non-tender              Adnexa: normal adnexa and no mass, fullness, tenderness              #2 pessary ring with support comfortable.  Able to maneuvers and maintains pessary comfortably. #3 ring with support and #2 incontinence dish too large each.  Mild bleeding with use.   Chaperone was present for exam.  ASSESSMENT  Incomplete uterovaginal prolapse. Mixed incontinence.  PLAN  Counseled regarding pessary use.  Will order pessary and then call patient to come in for receiving it and learning to place and remove.   Patient instructed to be prepared for mild spotting today. Will get up to date on mammogram.  If normal, can consider vaginal estrogen use.  Discussed risks of DVT, PE, MI, stroke, breast cancer.   No Rx at this time.   An After Visit Summary was printed and given to the patient.  _15_____ minutes face to face time of which over 50% was spent in counseling.

## 2015-03-08 ENCOUNTER — Other Ambulatory Visit: Payer: Self-pay | Admitting: Obstetrics and Gynecology

## 2015-03-08 DIAGNOSIS — R928 Other abnormal and inconclusive findings on diagnostic imaging of breast: Secondary | ICD-10-CM

## 2015-03-10 ENCOUNTER — Telehealth: Payer: Self-pay | Admitting: *Deleted

## 2015-03-10 NOTE — Telephone Encounter (Signed)
Calling patient to schedule pessary insertion Left Message To Call Back

## 2015-03-10 NOTE — Telephone Encounter (Signed)
Patient called back and I scheduled her for pessary insertion for 03/11/15 with Dr. Quincy Simmonds.  Routed to provider for review, encounter closed.

## 2015-03-11 ENCOUNTER — Ambulatory Visit (INDEPENDENT_AMBULATORY_CARE_PROVIDER_SITE_OTHER): Payer: BC Managed Care – PPO | Admitting: Obstetrics and Gynecology

## 2015-03-11 ENCOUNTER — Encounter: Payer: Self-pay | Admitting: Obstetrics and Gynecology

## 2015-03-11 VITALS — BP 116/78 | HR 70 | Resp 14 | Wt 136.8 lb

## 2015-03-11 DIAGNOSIS — N812 Incomplete uterovaginal prolapse: Secondary | ICD-10-CM

## 2015-03-11 NOTE — Progress Notes (Signed)
GYNECOLOGY  VISIT   HPI: 54 y.o.   Married  Caucasian  female   G0P0 with Patient's last menstrual period was 04/29/2014.   here for  Pessary Insertion. 2# pessary ring with support.   Has a diagnostic mammogram on 03/17/15 for an abnormal screening.  Will be in town from [place] for this.   GYNECOLOGIC HISTORY: Patient's last menstrual period was 04/29/2014. Contraception: postmenopausal Menopausal hormone therapy: none Last mammogram: 03-08-15 Density C/Lt.Breast poss. Distortion;Rt.Breast Neg--US and Lt.Diag mmg ordered for 03-17-15:[facility] Last pap smear: 7/ 29/16 wnl, HR HPV negative.        OB History    Gravida Para Term Preterm AB TAB SAB Ectopic Multiple Living   0                  There are no active problems to display for this patient.   Past Medical History  Diagnosis Date  . Chronic constipation     Past Surgical History  Procedure Laterality Date  . Cleft palate repair  infant  . Cosmetic surgery Left     hand    Current Outpatient Prescriptions  Medication Sig Dispense Refill  . Ascorbic Acid (VITAMIN C) 100 MG tablet Take 100 mg by mouth daily.    Marland Kitchen aspirin 325 MG buffered tablet Take 325 mg by mouth as needed.     . B Complex Vitamins (VITAMIN B COMPLEX PO) Take 1 capsule by mouth daily.    . Calcium-Magnesium-Vitamin D (CALCIUM MAGNESIUM PO) Take by mouth. 1-2 caps daily    . Digestive Enzymes (SIMILASE PO) Take by mouth.    . fish oil-omega-3 fatty acids 1000 MG capsule Take 2 g by mouth daily.    . Misc Natural Products (SUPER GREENS) POWD Take by mouth.    . Multiple Vitamin (MULTIVITAMIN) tablet Take 1 tablet by mouth daily.    Marland Kitchen OVER THE COUNTER MEDICATION daily. Thyroid Support 1-2 capsules    . OVER THE COUNTER MEDICATION daily. Adrenal Support 1-2 caps    . OVER THE COUNTER MEDICATION daily. Liver Support 1-2 capsules    . PROBIOTIC CAPS Take 1 capsule by mouth daily.    . Vitamin D, Ergocalciferol, (DRISDOL) 50000 UNITS CAPS  Take 50,000 Units by mouth every 7 (seven) days.     No current facility-administered medications for this visit.     ALLERGIES: Augmentin; Dairy aid; and Eggs or egg-derived products  Family History  Problem Relation Age of Onset  . Throat cancer Father     throat cancer  . Thyroid disease Father   . Hypertension Father   . Hyperlipidemia Father   . Thyroid disease Maternal Aunt   . Osteoporosis Maternal Aunt   . Lung cancer Maternal Uncle     lung cancer  . Heart disease Maternal Uncle   . Lung cancer Paternal Uncle     lung cancer  . Thyroid disease Maternal Grandmother   . Osteoporosis Maternal Grandmother   . Heart disease Maternal Grandfather   . Cancer Paternal Grandmother     Social History   Social History  . Marital Status: Married    Spouse Name: N/A  . Number of Children: N/A  . Years of Education: N/A   Occupational History  . Not on file.   Social History Main Topics  . Smoking status: Never Smoker   . Smokeless tobacco: Never Used  . Alcohol Use: No  . Drug Use: No  . Sexual Activity: Yes  Birth Control/ Protection: Post-menopausal   Other Topics Concern  . Not on file   Social History Narrative    ROS:  Pertinent items are noted in HPI.  PHYSICAL EXAMINATION:    BP 116/78 mmHg  Pulse 70  Resp 14  Wt 136 lb 12.8 oz (62.052 kg)  LMP 04/29/2014    General appearance: alert, cooperative and appears stated age   Pelvic: External genitalia:  no lesions              Urethra:  normal appearing urethra with no masses, tenderness or lesions              Bartholins and Skenes: normal                 Bimanual Exam:  Uterus:  normal size, contour, position, consistency, mobility, non-tender              Adnexa: normal adnexa and no mass, fullness, tenderness            2# ring with support by [employer]. Lot number [id number].  Chaperone was present for exam.  ASSESSMENT  Incomplete uterovaginal prolapse. Successful pessary fitting.    Abnormal mammogram.  PLAN  Counseled regarding pessary care.  Call for any bleeding, pain, or inability to void.  Patient to void prior to leaving office today.  Return in one week for recheck.  Can come same day as her dx mammogram and breast ultrasound.   An After Visit Summary was printed and given to the patient.  __15___ minutes face to face time of which over 50% was spent in counseling.

## 2015-03-14 ENCOUNTER — Other Ambulatory Visit: Payer: BC Managed Care – PPO

## 2015-03-16 ENCOUNTER — Ambulatory Visit: Payer: BC Managed Care – PPO | Admitting: Obstetrics and Gynecology

## 2015-03-16 ENCOUNTER — Telehealth: Payer: Self-pay | Admitting: Obstetrics and Gynecology

## 2015-03-16 NOTE — Telephone Encounter (Signed)
Patient dnka her 1 week recheck today @ 9:30am. I left a message for her to call and reschedule.

## 2015-03-16 NOTE — Telephone Encounter (Signed)
Thank you. Encounter closed. 

## 2015-03-16 NOTE — Telephone Encounter (Signed)
Patient thought her appointment was Thursday. Patient rescheduled to 03/17/15 @ 3:45pm

## 2015-03-17 ENCOUNTER — Ambulatory Visit (INDEPENDENT_AMBULATORY_CARE_PROVIDER_SITE_OTHER): Payer: BC Managed Care – PPO | Admitting: Obstetrics and Gynecology

## 2015-03-17 ENCOUNTER — Encounter: Payer: Self-pay | Admitting: Obstetrics and Gynecology

## 2015-03-17 ENCOUNTER — Ambulatory Visit
Admission: RE | Admit: 2015-03-17 | Discharge: 2015-03-17 | Disposition: A | Discharge status: Home / Self Care | Payer: BC Managed Care – PPO | Source: Ambulatory Visit | Attending: Obstetrics and Gynecology | Admitting: Obstetrics and Gynecology

## 2015-03-17 VITALS — BP 100/70 | HR 80 | Ht 65.5 in | Wt 137.4 lb

## 2015-03-17 DIAGNOSIS — N812 Incomplete uterovaginal prolapse: Secondary | ICD-10-CM | POA: Diagnosis not present

## 2015-03-17 DIAGNOSIS — R928 Other abnormal and inconclusive findings on diagnostic imaging of breast: Secondary | ICD-10-CM

## 2015-03-17 MED ORDER — ESTRADIOL 0.1 MG/GM VA CREA: TOPICAL_CREAM | VAGINAL | Status: DC

## 2015-03-17 NOTE — Progress Notes (Signed)
Patient ID: Angelica Warren, female   DOB: [birth year], 54 y.o.   MRN: [id number] GYNECOLOGY  VISIT   HPI: 54 y.o.   Married  Caucasian  female   G0P0 with Patient's last menstrual period was 04/29/2014.   here for pessary check.  Patient states doing well with pessary. Bowel movements vary with pessary use.  No difference in the consistency of the stool.  No regular stool softeners.   Able to place and remove pessary.  A little uncomfortable to do this.  No bleeding.  Interested to try vaginal estrogen cream.  Had 3D mammogram of left breast today with extra views and was told by Radiologist it only revealed dense breast tissue.  Negative for malignancy. Final reading - BI-RADS1.    GYNECOLOGIC HISTORY: Patient's last menstrual period was 04/29/2014. Contraception:Postmenopausal Menopausal hormone therapy: None Last mammogram: 03-08-15 Density Cat.C/Lt.Breast poss.distortion;Rt.Breast Neg--US and Lt.Diag.MMG done today:[facility]. Last pap smear: 02-25-15 Neg:Neg HR HPV        OB History    Gravida Para Term Preterm AB TAB SAB Ectopic Multiple Living   0                  There are no active problems to display for this patient.   Past Medical History  Diagnosis Date  . Chronic constipation     Past Surgical History  Procedure Laterality Date  . Cleft palate repair  infant  . Cosmetic surgery Left     hand    Current Outpatient Prescriptions  Medication Sig Dispense Refill  . Ascorbic Acid (VITAMIN C) 100 MG tablet Take 100 mg by mouth daily.    Marland Kitchen aspirin 325 MG buffered tablet Take 325 mg by mouth as needed.     . B Complex Vitamins (VITAMIN B COMPLEX PO) Take 1 capsule by mouth daily.    . Calcium-Magnesium-Vitamin D (CALCIUM MAGNESIUM PO) Take by mouth. 1-2 caps daily    . Digestive Enzymes (SIMILASE PO) Take by mouth.    . fish oil-omega-3 fatty acids 1000 MG capsule Take 2 g by mouth daily.    . Misc Natural Products (SUPER GREENS) POWD Take by mouth.     . Multiple Vitamin (MULTIVITAMIN) tablet Take 1 tablet by mouth daily.    Marland Kitchen OVER THE COUNTER MEDICATION daily. Thyroid Support 1-2 capsules    . OVER THE COUNTER MEDICATION daily. Adrenal Support 1-2 caps    . OVER THE COUNTER MEDICATION daily. Liver Support 1-2 capsules    . PROBIOTIC CAPS Take 1 capsule by mouth daily.    . Vitamin D, Ergocalciferol, (DRISDOL) 50000 UNITS CAPS Take 50,000 Units by mouth every 7 (seven) days.     No current facility-administered medications for this visit.     ALLERGIES: Augmentin; Dairy aid; and Eggs or egg-derived products  Family History  Problem Relation Age of Onset  . Throat cancer Father     throat cancer  . Thyroid disease Father   . Hypertension Father   . Hyperlipidemia Father   . Thyroid disease Maternal Aunt   . Osteoporosis Maternal Aunt   . Lung cancer Maternal Uncle     lung cancer  . Heart disease Maternal Uncle   . Lung cancer Paternal Uncle     lung cancer  . Thyroid disease Maternal Grandmother   . Osteoporosis Maternal Grandmother   . Heart disease Maternal Grandfather   . Cancer Paternal Grandmother     Social History   Social History  .  Marital Status: Married    Spouse Name: N/A  . Number of Children: N/A  . Years of Education: N/A   Occupational History  . Not on file.   Social History Main Topics  . Smoking status: Never Smoker   . Smokeless tobacco: Never Used  . Alcohol Use: No  . Drug Use: No  . Sexual Activity: Yes    Birth Control/ Protection: Post-menopausal   Other Topics Concern  . Not on file   Social History Narrative    ROS:  Pertinent items are noted in HPI.  PHYSICAL EXAMINATION:    BP 100/70 mmHg  Pulse 80  Ht 5' 5.5" (1.664 m)  Wt 137 lb 6.4 oz (62.324 kg)  BMI 22.51 kg/m2  LMP 04/29/2014    General appearance: alert, cooperative and appears stated age   Pelvic: External genitalia:  no lesions              Urethra:  normal appearing urethra with no masses, tenderness or  lesions              Bartholins and Skenes: normal                 Vagina: normal appearing vagina with normal color and discharge, no lesions.  High first degree rectocele/enterocele.              Cervix: no lesions               Bimanual Exam:  Uterus:  normal size, contour, position, consistency, mobility, non-tender              Adnexa: normal adnexa and no mass, fullness, tenderness            Pessary removed, cleansed, and replaced.  Chaperone was present for exam.  ASSESSMENT  Incomplete uterovaginal prolapse.  Normal dx mammogram and ultrasound of breast.  PLAN  Counseled regarding pessary use.  Recommend stool softener - Colace 100 g po q day if needed. Estrace vaginal cream 1/2 gm pv at hs x 2 weeks and then 2 x per week.  Discussed risk of DVT, PE, MI, stroke, breast CA.   Follow up prn.  An After Visit Summary was printed and given to the patient.  __15____ minutes face to face time of which over 50% was spent in counseling.

## 2015-03-17 NOTE — Patient Instructions (Signed)
Docusate capsules What is this medicine? DOCUSATE (doc CUE sayt) is stool softener. It helps prevent constipation and straining or discomfort associated with hard or dry stools. This medicine may be used for other purposes; ask your health care provider or pharmacist if you have questions. COMMON BRAND NAME(S): Colace, Colace Clear, Correctol, D.O.S., DC, Doc-Q-Lace, DocuLace, Docusoft S, DOK, Dulcolax, Genasoft, Kao-Tin, Kaopectate Liqui-Gels, Phillips Stool Softener, Stool Softener, Stool Softner DC, Sulfolax, Sur-Q-Lax, Surfak, Uni-Ease What should I tell my health care provider before I take this medicine? They need to know if you have any of these conditions: -nausea or vomiting -severe constipation -stomach pain -sudden change in bowel habit lasting more than 2 weeks -an unusual or allergic reaction to docusate, other medicines, foods, dyes, or preservatives -pregnant or trying to get pregnant -breast-feeding How should I use this medicine? Take this medicine by mouth with a glass of water. Follow the directions on the label. Take your doses at regular intervals. Do not take your medicine more often than directed. Talk to your pediatrician regarding the use of this medicine in children. While this medicine may be prescribed for children as young as 2 years for selected conditions, precautions do apply. Overdosage: If you think you have taken too much of this medicine contact a poison control center or emergency room at once. NOTE: This medicine is only for you. Do not share this medicine with others. What if I miss a dose? If you miss a dose, take it as soon as you can. If it is almost time for your next dose, take only that dose. Do not take double or extra doses. What may interact with this medicine? -mineral oil This list may not describe all possible interactions. Give your health care provider a list of all the medicines, herbs, non-prescription drugs, or dietary supplements you  use. Also tell them if you smoke, drink alcohol, or use illegal drugs. Some items may interact with your medicine. What should I watch for while using this medicine? Do not use for more than one week without advice from your doctor or health care professional. If your constipation returns, check with your doctor or health care professional. Drink plenty of water while taking this medicine. Drinking water helps decrease constipation. Stop using this medicine and contact your doctor or health care professional if you experience any rectal bleeding or do not have a bowel movement after use. These could be signs of a more serious condition. What side effects may I notice from receiving this medicine? Side effects that you should report to your doctor or health care professional as soon as possible: -allergic reactions like skin rash, itching or hives, swelling of the face, lips, or tongue Side effects that usually do not require medical attention (report to your doctor or health care professional if they continue or are bothersome): -diarrhea -stomach cramps -throat irritation This list may not describe all possible side effects. Call your doctor for medical advice about side effects. You may report side effects to FDA at 1-800-FDA-1088. Where should I keep my medicine? Keep out of the reach of children. Store at room temperature between 15 and 30 degrees C (59 and 86 degrees F). Throw away any unused medicine after the expiration date. NOTE: This sheet is a summary. It may not cover all possible information. If you have questions about this medicine, talk to your doctor, pharmacist, or health care provider.  2015, Elsevier/Gold Standard. (2007-11-06 15:56:49)

## 2016-02-16 ENCOUNTER — Encounter: Payer: Self-pay | Admitting: Obstetrics and Gynecology

## 2016-02-16 ENCOUNTER — Ambulatory Visit (INDEPENDENT_AMBULATORY_CARE_PROVIDER_SITE_OTHER): Payer: BC Managed Care – PPO | Admitting: Obstetrics and Gynecology

## 2016-02-16 ENCOUNTER — Ambulatory Visit: Payer: BC Managed Care – PPO | Admitting: Obstetrics and Gynecology

## 2016-02-16 VITALS — BP 110/64 | HR 64 | Resp 18 | Ht 65.5 in | Wt 138.4 lb

## 2016-02-16 DIAGNOSIS — N950 Postmenopausal bleeding: Secondary | ICD-10-CM

## 2016-02-16 DIAGNOSIS — Z01419 Encounter for gynecological examination (general) (routine) without abnormal findings: Secondary | ICD-10-CM | POA: Diagnosis not present

## 2016-02-16 DIAGNOSIS — Z0000 Encounter for general adult medical examination without abnormal findings: Secondary | ICD-10-CM | POA: Diagnosis not present

## 2016-02-16 DIAGNOSIS — Z113 Encounter for screening for infections with a predominantly sexual mode of transmission: Secondary | ICD-10-CM

## 2016-02-16 LAB — POCT URINALYSIS DIPSTICK
Bilirubin, UA: NEGATIVE
Blood, UA: NEGATIVE
Glucose, UA: NEGATIVE
Ketones, UA: NEGATIVE
Leukocytes, UA: NEGATIVE
Nitrite, UA: NEGATIVE
Protein, UA: NEGATIVE
Urobilinogen, UA: NEGATIVE
pH, UA: 5

## 2016-02-16 LAB — COMPREHENSIVE METABOLIC PANEL
ALT: 19 U/L (ref 6–29)
AST: 24 U/L (ref 10–35)
Albumin: 4.1 g/dL (ref 3.6–5.1)
Alkaline Phosphatase: 60 U/L (ref 33–130)
BUN: 19 mg/dL (ref 7–25)
CO2: 27 mmol/L (ref 20–31)
Calcium: 9.2 mg/dL (ref 8.6–10.4)
Chloride: 103 mmol/L (ref 98–110)
Creat: 0.97 mg/dL (ref 0.50–1.05)
Glucose, Bld: 80 mg/dL (ref 65–99)
Potassium: 4.4 mmol/L (ref 3.5–5.3)
Sodium: 141 mmol/L (ref 135–146)
Total Bilirubin: 0.3 mg/dL (ref 0.2–1.2)
Total Protein: 6.9 g/dL (ref 6.1–8.1)

## 2016-02-16 LAB — CBC
HCT: 38.3 % (ref 35.0–45.0)
Hemoglobin: 12.7 g/dL (ref 11.7–15.5)
MCH: 28.2 pg (ref 27.0–33.0)
MCHC: 33.2 g/dL (ref 32.0–36.0)
MCV: 85.1 fL (ref 80.0–100.0)
MPV: 9.3 fL (ref 7.5–12.5)
Platelets: 283 10*3/uL (ref 140–400)
RBC: 4.5 MIL/uL (ref 3.80–5.10)
RDW: 13.7 % (ref 11.0–15.0)
WBC: 8 10*3/uL (ref 3.8–10.8)

## 2016-02-16 LAB — LIPID PANEL
Cholesterol: 167 mg/dL (ref 125–200)
HDL: 85 mg/dL (ref >=46)
LDL Cholesterol: 50 mg/dL (ref <130)
Total CHOL/HDL Ratio: 2 Ratio (ref <=5.0)
Triglycerides: 158 mg/dL — ABNORMAL HIGH (ref <150)
VLDL: 32 mg/dL — ABNORMAL HIGH (ref <30)

## 2016-02-16 LAB — TSH: TSH: 0.54 mIU/L

## 2016-02-16 NOTE — Progress Notes (Signed)
55 y.o. G0P0 Married Caucasian female here for annual exam.    Had one to two more episodes of light vaginal spotting about 6 months ago.   Pelvic ultrasound in August 2016: Uterus - 0.81 mm intramural fibroid  EMS - 2.54 mm Ovaries - normal Free fluid - no  FSH 117.5 in July 2017.    Has known uterovaginal prolapse.  Has a pessary but not using a lot.  Difficult to keep in with BMs.  Some night time urination and loss of urine with cough and sneeze. Trying to avoid surgery currently.   Not using estrace vaginal cream because not using pessary.  Notes vaginal dryness and may need to restart due to this.   Anxiety currently.  Situational stress with work.  Mother moved in with her sister due to dementia.  Patient would like to move to [place].   PCP:   Darcus Austin, MD  Patient's last menstrual period was 04/29/2014.           Sexually active: Yes.   female The current method of family planning is post menopausal status.    Exercising: No.   Smoker:  no  Health Maintenance: Pap:  02-25-15 Neg:Neg HR HPV History of abnormal Pap:  no MMG:  03-07-15 Density C/Lt.Br.possible distortion, Rt.Br.neg./Lt.Diag.mmg 03-17-15/3D/Neg/BiRads1:[facility] Colonoscopy:  12/2012 normal;next due 2024. BMD:   n/a  Result  n/a TDaP:  2009 Gardasil:   N/A HIV: today Hep C: today Screening Labs:  Hb today: 12.6, Urine today: neg.   reports that she has never smoked. She has never used smokeless tobacco. She reports that she does not drink alcohol or use illicit drugs.  Past Medical History  Diagnosis Date  . Chronic constipation     Past Surgical History  Procedure Laterality Date  . Cleft palate repair  infant  . Cosmetic surgery Left     hand    Current Outpatient Prescriptions  Medication Sig Dispense Refill  . Ascorbic Acid (VITAMIN C) 100 MG tablet Take 100 mg by mouth daily.    Marland Kitchen aspirin 325 MG buffered tablet Take 325 mg by mouth as needed.     . B Complex Vitamins  (VITAMIN B COMPLEX PO) Take 1 capsule by mouth daily.    . Calcium-Magnesium-Vitamin D (CALCIUM MAGNESIUM PO) Take by mouth. 1-2 caps daily    . Digestive Enzymes (SIMILASE PO) Take by mouth.    . fish oil-omega-3 fatty acids 1000 MG capsule Take 2 g by mouth daily.    . Misc Natural Products (SUPER GREENS) POWD Take by mouth.    . Multiple Vitamin (MULTIVITAMIN) tablet Take 1 tablet by mouth daily.    Marland Kitchen OVER THE COUNTER MEDICATION daily. Thyroid Support 1-2 capsules    . OVER THE COUNTER MEDICATION daily. Adrenal Support 1-2 caps    . OVER THE COUNTER MEDICATION daily. Liver Support 1-2 capsules    . PROBIOTIC CAPS Take 1 capsule by mouth daily.    Marland Kitchen estradiol (ESTRACE) 0.1 MG/GM vaginal cream Use 1/2 g vaginally every night for the first 2 weeks, then use 1/2 g vaginally at bed time twice a week. (Patient not taking: Reported on 02/16/2016) 42.5 g 2   No current facility-administered medications for this visit.    Family History  Problem Relation Age of Onset  . Throat cancer Father     throat cancer  . Thyroid disease Father   . Hypertension Father   . Hyperlipidemia Father   . Thyroid disease Maternal  Aunt   . Osteoporosis Maternal Aunt   . Lung cancer Maternal Uncle     lung cancer  . Heart disease Maternal Uncle   . Lung cancer Paternal Uncle     lung cancer  . Thyroid disease Maternal Grandmother   . Osteoporosis Maternal Grandmother   . Heart disease Maternal Grandfather   . Cancer Paternal Grandmother   . Dementia Mother     ROS:  Pertinent items are noted in HPI.  Otherwise, a comprehensive ROS was negative.  Exam:   BP 110/64 mmHg  Pulse 64  Resp 18  Ht 5' 5.5" (1.664 m)  Wt 138 lb 6.4 oz (62.778 kg)  BMI 22.67 kg/m2  LMP 04/29/2014    General appearance: alert, cooperative and appears stated age Head: Normocephalic, without obvious abnormality, atraumatic Neck: no adenopathy, supple, symmetrical, trachea midline and thyroid normal to inspection and  palpation Lungs: clear to auscultation bilaterally Breasts: normal appearance, no masses or tenderness Heart: regular rate and rhythm Abdomen: incisions:  No.    , soft, non-tender; no masses, no organomegaly Extremities: extremities normal, atraumatic, no cyanosis or edema Skin: Skin color, texture, turgor normal. No rashes or lesions Lymph nodes: Cervical, supraclavicular, and axillary nodes normal. No abnormal inguinal nodes palpated Neurologic: Grossly normal  Pelvic: External genitalia:  no lesions              Urethra:  normal appearing urethra with no masses, tenderness or lesions              Bartholins and Skenes: normal                 Vagina: normal appearing vagina with normal color and discharge, no lesions.  Minimal uterine prolapse.  No cystocele.  First degree rectocele.              Cervix: no lesions              Pap taken: No. Bimanual Exam:  Uterus:  normal size, contour, position, consistency, mobility, non-tender              Adnexa: normal adnexa and no mass, fullness, tenderness              Rectal exam: Yes.  .  Confirms.              Anus:  normal sphincter tone, no lesions  Chaperone was present for exam.  Assessment:   Well woman visit with normal exam. Recurrent postmenopausal bleeding.  Small uterine fibroid.  No prior EMB. Incomplete uterovaginal prolapse.  Vaginal atrophy symptoms.  Desire for Hep C and HIV testing and general blood work.  Situational stress.   Plan: Yearly mammogram recommended after age 72.  Recommended self breast exam.  Pap and HR HPV as above. Discussed Calcium, Vitamin D, regular exercise program including cardiovascular and weight bearing exercise. Labs performed.  Yes.  .   See orders.  HIV, hep C, routine labs. Prescription medication(s) given.  No..   Return for sonohysterogram and EMB.  Can prescribe vaginal estrogen after EMB results back.  Discussed resources for patients with dementia. Follow up annually and prn.        After visit summary provided.

## 2016-02-16 NOTE — Patient Instructions (Signed)
Health Maintenance, Female Adopting a healthy lifestyle and getting preventive care can go a long way to promote health and wellness. Talk with your health care provider about what schedule of regular examinations is right for you. This is a good chance for you to check in with your provider about disease prevention and staying healthy. In between checkups, there are plenty of things you can do on your own. Experts have done a lot of research about which lifestyle changes and preventive measures are most likely to keep you healthy. Ask your health care provider for more information. WEIGHT AND DIET  Eat a healthy diet  Be sure to include plenty of vegetables, fruits, low-fat dairy products, and lean protein.  Do not eat a lot of foods high in solid fats, added sugars, or salt.  Get regular exercise. This is one of the most important things you can do for your health.  Most adults should exercise for at least 150 minutes each week. The exercise should increase your heart rate and make you sweat (moderate-intensity exercise).  Most adults should also do strengthening exercises at least twice a week. This is in addition to the moderate-intensity exercise.  Maintain a healthy weight  Body mass index (BMI) is a measurement that can be used to identify possible weight problems. It estimates body fat based on height and weight. Your health care provider can help determine your BMI and help you achieve or maintain a healthy weight.  For females 20 years of age and older:   A BMI below 18.5 is considered underweight.  A BMI of 18.5 to 24.9 is normal.  A BMI of 25 to 29.9 is considered overweight.  A BMI of 30 and above is considered obese.  Watch levels of cholesterol and blood lipids  You should start having your blood tested for lipids and cholesterol at 53 years of age, then have this test every 5 years.  You may need to have your cholesterol levels checked more often if:  Your lipid  or cholesterol levels are high.  You are older than 53 years of age.  You are at high risk for heart disease.  CANCER SCREENING   Lung Cancer  Lung cancer screening is recommended for adults 55-80 years old who are at high risk for lung cancer because of a history of smoking.  A yearly low-dose CT scan of the lungs is recommended for people who:  Currently smoke.  Have quit within the past 15 years.  Have at least a 30-pack-year history of smoking. A pack year is smoking an average of one pack of cigarettes a day for 1 year.  Yearly screening should continue until it has been 15 years since you quit.  Yearly screening should stop if you develop a health problem that would prevent you from having lung cancer treatment.  Breast Cancer  Practice breast self-awareness. This means understanding how your breasts normally appear and feel.  It also means doing regular breast self-exams. Let your health care provider know about any changes, no matter how small.  If you are in your 20s or 30s, you should have a clinical breast exam (CBE) by a health care provider every 1-3 years as part of a regular health exam.  If you are 40 or older, have a CBE every year. Also consider having a breast X-ray (mammogram) every year.  If you have a family history of breast cancer, talk to your health care provider about genetic screening.  If you   are at high risk for breast cancer, talk to your health care provider about having an MRI and a mammogram every year.  Breast cancer gene (BRCA) assessment is recommended for women who have family members with BRCA-related cancers. BRCA-related cancers include:  Breast.  Ovarian.  Tubal.  Peritoneal cancers.  Results of the assessment will determine the need for genetic counseling and BRCA1 and BRCA2 testing. Cervical Cancer Your health care provider may recommend that you be screened regularly for cancer of the pelvic organs (ovaries, uterus, and  vagina). This screening involves a pelvic examination, including checking for microscopic changes to the surface of your cervix (Pap test). You may be encouraged to have this screening done every 3 years, beginning at age 21.  For women ages 30-65, health care providers may recommend pelvic exams and Pap testing every 3 years, or they may recommend the Pap and pelvic exam, combined with testing for human papilloma virus (HPV), every 5 years. Some types of HPV increase your risk of cervical cancer. Testing for HPV may also be done on women of any age with unclear Pap test results.  Other health care providers may not recommend any screening for nonpregnant women who are considered low risk for pelvic cancer and who do not have symptoms. Ask your health care provider if a screening pelvic exam is right for you.  If you have had past treatment for cervical cancer or a condition that could lead to cancer, you need Pap tests and screening for cancer for at least 20 years after your treatment. If Pap tests have been discontinued, your risk factors (such as having a new sexual partner) need to be reassessed to determine if screening should resume. Some women have medical problems that increase the chance of getting cervical cancer. In these cases, your health care provider may recommend more frequent screening and Pap tests. Colorectal Cancer  This type of cancer can be detected and often prevented.  Routine colorectal cancer screening usually begins at 53 years of age and continues through 53 years of age.  Your health care provider may recommend screening at an earlier age if you have risk factors for colon cancer.  Your health care provider may also recommend using home test kits to check for hidden blood in the stool.  A small camera at the end of a tube can be used to examine your colon directly (sigmoidoscopy or colonoscopy). This is done to check for the earliest forms of colorectal  cancer.  Routine screening usually begins at age 50.  Direct examination of the colon should be repeated every 5-10 years through 53 years of age. However, you may need to be screened more often if early forms of precancerous polyps or small growths are found. Skin Cancer  Check your skin from head to toe regularly.  Tell your health care provider about any new moles or changes in moles, especially if there is a change in a mole's shape or color.  Also tell your health care provider if you have a mole that is larger than the size of a pencil eraser.  Always use sunscreen. Apply sunscreen liberally and repeatedly throughout the day.  Protect yourself by wearing long sleeves, pants, a wide-brimmed hat, and sunglasses whenever you are outside. HEART DISEASE, DIABETES, AND HIGH BLOOD PRESSURE   High blood pressure causes heart disease and increases the risk of stroke. High blood pressure is more likely to develop in:  People who have blood pressure in the high end   of the normal range (130-139/85-89 mm Hg).  People who are overweight or obese.  People who are African American.  If you are 38-23 years of age, have your blood pressure checked every 3-5 years. If you are 61 years of age or older, have your blood pressure checked every year. You should have your blood pressure measured twice--once when you are at a hospital or clinic, and once when you are not at a hospital or clinic. Record the average of the two measurements. To check your blood pressure when you are not at a hospital or clinic, you can use:  An automated blood pressure machine at a pharmacy.  A home blood pressure monitor.  If you are between 45 years and 39 years old, ask your health care provider if you should take aspirin to prevent strokes.  Have regular diabetes screenings. This involves taking a blood sample to check your fasting blood sugar level.  If you are at a normal weight and have a low risk for diabetes,  have this test once every three years after 53 years of age.  If you are overweight and have a high risk for diabetes, consider being tested at a younger age or more often. PREVENTING INFECTION  Hepatitis B  If you have a higher risk for hepatitis B, you should be screened for this virus. You are considered at high risk for hepatitis B if:  You were born in a country where hepatitis B is common. Ask your health care provider which countries are considered high risk.  Your parents were born in a high-risk country, and you have not been immunized against hepatitis B (hepatitis B vaccine).  You have HIV or AIDS.  You use needles to inject street drugs.  You live with someone who has hepatitis B.  You have had sex with someone who has hepatitis B.  You get hemodialysis treatment.  You take certain medicines for conditions, including cancer, organ transplantation, and autoimmune conditions. Hepatitis C  Blood testing is recommended for:  Everyone born from [birth year] through 1963.  Anyone with known risk factors for hepatitis C. Sexually transmitted infections (STIs)  You should be screened for sexually transmitted infections (STIs) including gonorrhea and chlamydia if:  You are sexually active and are younger than 53 years of age.  You are older than 53 years of age and your health care provider tells you that you are at risk for this type of infection.  Your sexual activity has changed since you were last screened and you are at an increased risk for chlamydia or gonorrhea. Ask your health care provider if you are at risk.  If you do not have HIV, but are at risk, it may be recommended that you take a prescription medicine daily to prevent HIV infection. This is called pre-exposure prophylaxis (PrEP). You are considered at risk if:  You are sexually active and do not regularly use condoms or know the HIV status of your partner(s).  You take drugs by injection.  You are sexually  active with a partner who has HIV. Talk with your health care provider about whether you are at high risk of being infected with HIV. If you choose to begin PrEP, you should first be tested for HIV. You should then be tested every 3 months for as long as you are taking PrEP.  PREGNANCY   If you are premenopausal and you may become pregnant, ask your health care provider about preconception counseling.  If you may  become pregnant, take 400 to 800 micrograms (mcg) of folic acid every day.  If you want to prevent pregnancy, talk to your health care provider about birth control (contraception). OSTEOPOROSIS AND MENOPAUSE   Osteoporosis is a disease in which the bones lose minerals and strength with aging. This can result in serious bone fractures. Your risk for osteoporosis can be identified using a bone density scan.  If you are 61 years of age or older, or if you are at risk for osteoporosis and fractures, ask your health care provider if you should be screened.  Ask your health care provider whether you should take a calcium or vitamin D supplement to lower your risk for osteoporosis.  Menopause may have certain physical symptoms and risks.  Hormone replacement therapy may reduce some of these symptoms and risks. Talk to your health care provider about whether hormone replacement therapy is right for you.  HOME CARE INSTRUCTIONS   Schedule regular health, dental, and eye exams.  Stay current with your immunizations.   Do not use any tobacco products including cigarettes, chewing tobacco, or electronic cigarettes.  If you are pregnant, do not drink alcohol.  If you are breastfeeding, limit how much and how often you drink alcohol.  Limit alcohol intake to no more than 1 drink per day for nonpregnant women. One drink equals 12 ounces of beer, 5 ounces of wine, or 1 ounces of hard liquor.  Do not use street drugs.  Do not share needles.  Ask your health care provider for help if  you need support or information about quitting drugs.  Tell your health care provider if you often feel depressed.  Tell your health care provider if you have ever been abused or do not feel safe at home.   This information is not intended to replace advice given to you by your health care provider. Make sure you discuss any questions you have with your health care provider.   Document Released: 01/29/2011 Document Revised: 08/06/2014 Document Reviewed: 06/17/2013 Elsevier Interactive Patient Education [employer].

## 2016-02-17 LAB — HEMOGLOBIN, FINGERSTICK: Hemoglobin, fingerstick: 12.6 g/dL (ref 12.0–16.0)

## 2016-02-17 LAB — VITAMIN D 25 HYDROXY (VIT D DEFICIENCY, FRACTURES): Vit D, 25-Hydroxy: 32 ng/mL (ref 30–100)

## 2016-02-17 LAB — HIV ANTIBODY (ROUTINE TESTING W REFLEX): HIV 1&2 Ab, 4th Generation: NONREACTIVE

## 2016-02-17 LAB — HEPATITIS C ANTIBODY: HCV Ab: NEGATIVE

## 2016-02-28 ENCOUNTER — Ambulatory Visit: Payer: BC Managed Care – PPO | Admitting: Nurse Practitioner

## 2016-03-01 ENCOUNTER — Encounter: Payer: Self-pay | Admitting: Obstetrics and Gynecology

## 2016-03-01 ENCOUNTER — Other Ambulatory Visit: Payer: Self-pay | Admitting: Obstetrics and Gynecology

## 2016-03-01 ENCOUNTER — Ambulatory Visit (INDEPENDENT_AMBULATORY_CARE_PROVIDER_SITE_OTHER): Payer: BC Managed Care – PPO | Admitting: Obstetrics and Gynecology

## 2016-03-01 ENCOUNTER — Ambulatory Visit (INDEPENDENT_AMBULATORY_CARE_PROVIDER_SITE_OTHER): Payer: BC Managed Care – PPO

## 2016-03-01 VITALS — BP 110/68 | HR 70 | Ht 65.5 in | Wt 137.0 lb

## 2016-03-01 DIAGNOSIS — N950 Postmenopausal bleeding: Secondary | ICD-10-CM

## 2016-03-01 DIAGNOSIS — D259 Leiomyoma of uterus, unspecified: Secondary | ICD-10-CM

## 2016-03-01 NOTE — Progress Notes (Signed)
GYNECOLOGY  VISIT   HPI: 55 y.o.   Married  Caucasian  female   G0P0 with Patient's last menstrual period was 04/29/2014.   here for pelvic ultrasound for __recurrent postmenopausal bleeding________________.   Last episode of bleeding was months ago.   Had postmenopausal bleeding last year and had unremarkable pelvic ultrasound.  [place] in 2016 and 2015 confirms menopause.   Not on HRT.   Has known prolapse and atrophy.  Interested in vaginal estrogen cream. Has used in the past.   Will be taking care of her mother for the next year.  Mother has cognitive decline.  GYNECOLOGIC HISTORY: Patient's last menstrual period was 04/29/2014. Contraception:  NA Menopausal hormone therapy:  None.        OB History    Gravida Para Term Preterm AB Living   0             SAB TAB Ectopic Multiple Live Births                     There are no active problems to display for this patient.   Past Medical History:  Diagnosis Date  . Chronic constipation     Past Surgical History:  Procedure Laterality Date  . CLEFT PALATE REPAIR  infant  . COSMETIC SURGERY Left    hand    Current Outpatient Prescriptions  Medication Sig Dispense Refill  . Ascorbic Acid (VITAMIN C) 100 MG tablet Take 100 mg by mouth daily.    Marland Kitchen aspirin 325 MG buffered tablet Take 325 mg by mouth as needed.     . B Complex Vitamins (VITAMIN B COMPLEX PO) Take 1 capsule by mouth daily.    . Calcium-Magnesium-Vitamin D (CALCIUM MAGNESIUM PO) Take by mouth. 1-2 caps daily    . Digestive Enzymes (SIMILASE PO) Take by mouth.    . fish oil-omega-3 fatty acids 1000 MG capsule Take 2 g by mouth daily.    . Misc Natural Products (SUPER GREENS) POWD Take by mouth.    . Multiple Vitamin (MULTIVITAMIN) tablet Take 1 tablet by mouth daily.    Marland Kitchen OVER THE COUNTER MEDICATION daily. Thyroid Support 1-2 capsules    . OVER THE COUNTER MEDICATION daily. Adrenal Support 1-2 caps    . OVER THE COUNTER MEDICATION daily. Liver Support 1-2  capsules    . PROBIOTIC CAPS Take 1 capsule by mouth daily.    Marland Kitchen estradiol (ESTRACE) 0.1 MG/GM vaginal cream Use 1/2 g vaginally every night for the first 2 weeks, then use 1/2 g vaginally at bed time twice a week. (Patient not taking: Reported on 02/16/2016) 42.5 g 2   No current facility-administered medications for this visit.      ALLERGIES: Augmentin [amoxicillin-pot clavulanate]; Dairy aid [lactase]; and Eggs or egg-derived products  Family History  Problem Relation Age of Onset  . Throat cancer Father     throat cancer  . Thyroid disease Father   . Hypertension Father   . Hyperlipidemia Father   . Thyroid disease Maternal Aunt   . Osteoporosis Maternal Aunt   . Lung cancer Maternal Uncle     lung cancer  . Heart disease Maternal Uncle   . Lung cancer Paternal Uncle     lung cancer  . Thyroid disease Maternal Grandmother   . Osteoporosis Maternal Grandmother   . Heart disease Maternal Grandfather   . Cancer Paternal Grandmother   . Dementia Mother     Social History   Social History  .  Marital status: Married    Spouse name: N/A  . Number of children: N/A  . Years of education: N/A   Occupational History  . Not on file.   Social History Main Topics  . Smoking status: Never Smoker  . Smokeless tobacco: Never Used  . Alcohol use No  . Drug use: No  . Sexual activity: Yes    Partners: Male    Birth control/ protection: Post-menopausal   Other Topics Concern  . Not on file   Social History Narrative  . No narrative on file    ROS:  Pertinent items are noted in HPI.  PHYSICAL EXAMINATION:    BP 110/68 (BP Location: Right Arm, Patient Position: Sitting, Cuff Size: Normal)   Pulse 70   Ht 5' 5.5" (1.664 m)   Wt 137 lb (62.1 kg)   LMP 04/29/2014 Comment: pt is unsure when LMP.   BMI 22.45 kg/m     General appearance: alert, cooperative and appears stated age   Technique:  Both transabdominal and transvaginal ultrasound examinations of the pelvis were  performed. Transabdominal technique was performed for global imaging of the pelvis including uterus, ovaries, adnexal regions, and pelvic cul-de-sac. It was necessary to proceed with endovaginal exam following the abdominal ultrasound.  Transabdominal exam to visualize the endometrium and adnexa.  Color and duplex Doppler ultrasound was utilized to evaluate blood flow to the ovaries.   Pelvic ultrasound today: Uterine fibroid 0.8 cm.  EMS 1.29 mm.  Normal ovaries.  No free fluid.  Procedure - sonohysterogram Consent performed. Speculum placed in vagina. Sterile prep of cervix with Hibiclens. Cervix stenotic.  Paracervical block with 10 cc 1% lidocaine - lot [id number], exp 09/27/16. Scalpel used to open cervical os. Os finder used.  Dilator used to further dilate the canal. Cannula placed inside endometrial cavity without difficulty. Speculum removed. Sterile saline injected.       No       filling defect noted. Cannula removed. No complication.   Procedure - endometrial biopsy Consent performed. Speculum place in vagina.  Sterile prep of cervix with Hibiclens. Tenaculum to anterior cervical lip. Pipelle placed to     7     cm without difficulty twice. Tissue obtained and sent to pathology. Speculum removed.  No complications. Minimal EBL.  Chaperone was present for exam.  ASSESSMENT  Recurrent postmenopausal bleeding.  Normal ultrasound today.  Small fibroid. Vaginal atrophy.   PLAN  Discussion of postmenopausal bleeding.  Atrophy verus prolapse as the source. Follow up EMB.  Instructions/precautions given.  Will do Rx. For vaginal estrogen if the EMB is normal.  Guidance given regarding resources for cognitive care patients.    An After Visit Summary was printed and given to the patient.  __15____ minutes face to face time of which over 50% was spent in counseling.

## 2016-03-01 NOTE — Patient Instructions (Signed)

## 2016-03-05 LAB — IPS OTHER TISSUE BIOPSY

## 2016-03-12 ENCOUNTER — Telehealth: Payer: Self-pay | Admitting: *Deleted

## 2016-03-12 DIAGNOSIS — N950 Postmenopausal bleeding: Secondary | ICD-10-CM

## 2016-03-12 NOTE — Telephone Encounter (Signed)
-----   Message from Nunzio Cobbs, MD sent at 03/08/2016  6:43 AM EDT ----- Please report results of EMB to patient which show weakly proliferative endometrium.  There is no cancer or precancer present.  This report shows estrogenic effect of the lining of the uterus and may explain the bleeding.  This needs to be treated with progesterone supplementation to prevent endometrial hyperplasia. I am recommending Provera 10 mg po x 10 days for the next 6 months and then redoing an endometrial biopsy.  Dispense: 10, RF 5. Side effects may be breast tenderness, bloating, and PMS type symptoms.  Patient may now also begin vaginal Premarin cream 1/2 gram pv at hs for 2 weeks and then use 1/2 gram pv at hs twice weekly. Dispense:  30 gram, RF one.   Send prescriptions to pharmacy of choice.   Please schedule follow up EMB for February 2017.  This will need to be in a precert queue for then as well.   Thank you.  Cc- Marisa Sprinkles

## 2016-03-12 NOTE — Telephone Encounter (Signed)
Call to patient, left message to call back. Ask for [facility] Filler or [place].

## 2016-03-20 MED ORDER — MEDROXYPROGESTERONE ACETATE 10 MG PO TABS: ORAL_TABLET | ORAL | 5 refills | Status: DC

## 2016-03-20 MED ORDER — ESTROGENS, CONJUGATED 0.625 MG/GM VA CREA: TOPICAL_CREAM | VAGINAL | 1 refills | Status: DC

## 2016-03-20 NOTE — Telephone Encounter (Signed)
Call to patient. Reviewed endometrial boipsy result with patient and instructions for Provera from Dr Quincy Simmonds. Also reviewed Premarin cream instructions. Patient states she will likely hold off on starting Provera till 03-30-16 due to travel plans. Endo Biopsy scheduled for 10-31-16, six months following start of Provera. Instructed to keep menstrual calendar of any bleeding that occurs after Provera. Instructions provided in detail and patient advised to call back if needs additional clarification.  Routing to provider for final review. Patient agreeable to disposition. Will close encounter.

## 2016-11-12 ENCOUNTER — Telehealth: Payer: Self-pay | Admitting: Obstetrics and Gynecology

## 2016-11-12 NOTE — Telephone Encounter (Signed)
Spoke with patient regarding upcoming appointment for a follow up endometrial biopsy with Dr. Quincy Simmonds. Patient understood and agreeable to benefit, arrival date and time and cancellation policy.   Patient voiced questions regarding reason for repeat biopsy and agreeable to return call from nurse to review.  Routing to triage to call patient.

## 2016-11-12 NOTE — Telephone Encounter (Signed)
Spoke with patient. Advised of results and message as seen below from [place] from 03/08/2016. Advised of importance of having repeat biopsy due to estrogenic effect of the lining of her uterus. Advised recheck is needed following taking Provera to reassess uterine lining. Patient verbalizes understanding and will keep appointment as scheduled for 11/21/2016 at 10 am.    ----- Message from Nunzio Cobbs, MD sent at 03/08/2016  6:43 AM EDT ----- Please report results of EMB to patient which show weakly proliferative endometrium.  There is no cancer or precancer present.  This report shows estrogenic effect of the lining of the uterus and may explain the bleeding.  This needs to be treated with progesterone supplementation to prevent endometrial hyperplasia. I am recommending Provera 10 mg po x 10 days for the next 6 months and then redoing an endometrial biopsy.  Dispense: 10, RF 5. Side effects may be breast tenderness, bloating, and PMS type symptoms.  Patient may now also begin vaginal Premarin cream 1/2 gram pv at hs for 2 weeks and then use 1/2 gram pv at hs twice weekly. Dispense:  30 gram, RF one.   Send prescriptions to pharmacy of choice.   Please schedule follow up EMB for February 2017.  This will need to be in a precert queue for then as well.   Thank you.  [place] to provider for final review. Patient agreeable to disposition. Will close encounter.

## 2016-11-21 ENCOUNTER — Encounter: Payer: Self-pay | Admitting: Obstetrics and Gynecology

## 2016-11-21 ENCOUNTER — Other Ambulatory Visit: Payer: Self-pay | Admitting: Obstetrics and Gynecology

## 2016-11-21 ENCOUNTER — Ambulatory Visit (INDEPENDENT_AMBULATORY_CARE_PROVIDER_SITE_OTHER): Payer: BC Managed Care – PPO | Admitting: Obstetrics and Gynecology

## 2016-11-21 DIAGNOSIS — N950 Postmenopausal bleeding: Secondary | ICD-10-CM

## 2016-11-21 DIAGNOSIS — Z1231 Encounter for screening mammogram for malignant neoplasm of breast: Secondary | ICD-10-CM

## 2016-11-21 NOTE — Patient Instructions (Signed)

## 2016-11-21 NOTE — Progress Notes (Signed)
GYNECOLOGY  VISIT   HPI: 56 y.o.   Married  Caucasian  female   G0P0 with Patient's last menstrual period was 11/28/2014.here for endometrial biopsy.    Patient states she has not been taking the Provera or Premarin cream. Was inconvenient for her.  She moved from [place] and is taking care of her mother who has cognitive decline.  Hx recurrent postmenopausal bleeding.  None since she was last here. Known small fibroid by ultrasound on 03/01/16.  EMB same day showed weakly proliferative endometrium.   Needs to update her mammogram.   GYNECOLOGIC HISTORY: Patient's last menstrual period was 11/28/2014. Contraception:  Postmenopausal Menopausal hormone therapy: None Last mammogram:03-07-15 Density C/Lt.Br.possible distortion, Rt.Br.neg./Lt.Diag.mmg 03-17-15/3D/Neg/BiRads1:[facility]  Last pap smear: 02-25-15 Neg:Neg HR HPV; 02-19-12 Neg:Neg HR HPV        OB History    Gravida Para Term Preterm AB Living   0             SAB TAB Ectopic Multiple Live Births                     There are no active problems to display for this patient.   Past Medical History:  Diagnosis Date  . Chronic constipation     Past Surgical History:  Procedure Laterality Date  . CLEFT PALATE REPAIR  infant  . COSMETIC SURGERY Left    hand    Current Outpatient Prescriptions  Medication Sig Dispense Refill  . Ascorbic Acid (VITAMIN C) 100 MG tablet Take 100 mg by mouth daily.    . ASHWAGANDHA PO Take 1 tablet by mouth 2 (two) times daily.    Marland Kitchen aspirin 325 MG buffered tablet Take 325 mg by mouth as needed.     . B Complex Vitamins (VITAMIN B COMPLEX PO) Take 1 capsule by mouth daily.    . Calcium-Magnesium-Vitamin D (CALCIUM MAGNESIUM PO) Take by mouth. 1-2 caps daily    . Digestive Enzymes (SIMILASE PO) Take by mouth.    . estradiol (ESTRACE) 0.1 MG/GM vaginal cream Use 1/2 g vaginally every night for the first 2 weeks, then use 1/2 g vaginally at bed time twice a week. 42.5 g 2  . fish  oil-omega-3 fatty acids 1000 MG capsule Take 2 g by mouth daily.    . Misc Natural Products (SUPER GREENS) POWD Take by mouth.    . Multiple Vitamin (MULTIVITAMIN) tablet Take 1 tablet by mouth daily.    Marland Kitchen OVER THE COUNTER MEDICATION daily. Thyroid Support 1-2 capsules    . OVER THE COUNTER MEDICATION daily. Adrenal Support 1-2 caps    . OVER THE COUNTER MEDICATION daily. Liver Support 1-2 capsules    . PROBIOTIC CAPS Take 1 capsule by mouth daily.    . TURMERIC PO Take 1 tablet by mouth 2 (two) times daily.    Marland Kitchen conjugated estrogens (PREMARIN) vaginal cream Use 1/2 gram (see applicator) vaginally every night for two weeks. Then decrease to twice each week. (Patient not taking: Reported on 11/21/2016) 30 g 1  . medroxyPROGESTERone (PROVERA) 10 MG tablet Take one table daily for 10 days and repeat monthly for 6 months. (Patient not taking: Reported on 11/21/2016) 10 tablet 5   No current facility-administered medications for this visit.      ALLERGIES: Augmentin [amoxicillin-pot clavulanate]; Dairy aid [lactase]; and Eggs or egg-derived products  Family History  Problem Relation Age of Onset  . Throat cancer Father     throat cancer  .  Thyroid disease Father   . Hypertension Father   . Hyperlipidemia Father   . Thyroid disease Maternal Aunt   . Osteoporosis Maternal Aunt   . Lung cancer Maternal Uncle     lung cancer  . Heart disease Maternal Uncle   . Lung cancer Paternal Uncle     lung cancer  . Thyroid disease Maternal Grandmother   . Osteoporosis Maternal Grandmother   . Heart disease Maternal Grandfather   . Cancer Paternal Grandmother   . Dementia Mother     Social History   Social History  . Marital status: Married    Spouse name: N/A  . Number of children: N/A  . Years of education: N/A   Occupational History  . Not on file.   Social History Main Topics  . Smoking status: Never Smoker  . Smokeless tobacco: Never Used  . Alcohol use No  . Drug use: No  .  Sexual activity: Yes    Partners: Male    Birth control/ protection: Post-menopausal   Other Topics Concern  . Not on file   Social History Narrative  . No narrative on file    ROS:  Pertinent items are noted in HPI.  PHYSICAL EXAMINATION:    BP 100/64 (BP Location: Right Arm, Patient Position: Sitting, Cuff Size: Normal)   Pulse 70   Ht 5' 5.5" (1.664 m)   Wt 127 lb 3.2 oz (57.7 kg)   LMP 11/28/2014 Comment: Spotting  BMI 20.85 kg/m     General appearance: alert, cooperative and appears stated age  Pelvic: External genitalia:  no lesions              Urethra:  normal appearing urethra with no masses, tenderness or lesions              Bartholins and Skenes: normal                 Vagina: normal appearing vagina with normal color and discharge, no lesions              Cervix: no lesions                Bimanual Exam:  Uterus:  normal size, contour, position, consistency, mobility, non-tender              Adnexa: no mass, fullness, tenderness               Endometrial biopsy.  Consent for procedure.  Speculum placed.  Sterile prep with Hibiclens. Paracervical block with 8 cc 1% lidocaine - lot number [id number], exp 02/21. Os finder used.  Pipelle placed to 7 cm twice.  Minimal tissue obtained - to pathology.  Minimal EBL.  No complications.   Chaperone was present for exam.  ASSESSMENT  Recurrent postmenopausal bleeding.  Now resolved.  Proliferative endometrium.  No Provera use.   PLAN  Discussion about proliferative endometrium/hyperplasia/endometrial cancer. EMB performed today.  Instructions/precautions given.  Final plan to follow. Will schedule mammogram for patient.  Has annual exam for July 2018.   An After Visit Summary was printed and given to the patient.  _15_____ minutes face to face time of which over 50% was spent in counseling.

## 2016-11-21 NOTE — Progress Notes (Signed)
Patient scheduled while in office. Spoke with [place] at [facility]. Patient scheduled for screening MMG on 12/10/16 arriving at 2:50pm for 3:10pm appointment. Patient verbalizes understanding and is agreeable.

## 2016-11-23 LAB — IPS OTHER TISSUE BIOPSY

## 2016-11-26 ENCOUNTER — Telehealth: Payer: Self-pay

## 2016-11-26 NOTE — Telephone Encounter (Signed)
-----   Message from Nunzio Cobbs, MD sent at 11/24/2016  8:11 PM EDT ----- Please report EMB results to patient which showed benign atrophic endometrium. No proliferation, hyperplasia, or malignancy were seen.  No progesterone is needed.  I will see her for her annual exam.  Call for any vaginal bleeding or spotting.

## 2016-11-26 NOTE — Telephone Encounter (Signed)
Spoke with patient. Advised of results as seen below from Dr.Silva. Patient is agreeable and verbalizes understanding.  Routing to provider for final review. Patient agreeable to disposition. Will close encounter.     

## 2016-12-10 ENCOUNTER — Ambulatory Visit
Admission: RE | Admit: 2016-12-10 | Discharge: 2016-12-10 | Disposition: A | Discharge status: Home / Self Care | Payer: BC Managed Care – PPO | Source: Ambulatory Visit | Attending: Obstetrics and Gynecology | Admitting: Obstetrics and Gynecology

## 2016-12-10 DIAGNOSIS — Z1231 Encounter for screening mammogram for malignant neoplasm of breast: Secondary | ICD-10-CM

## 2017-02-18 ENCOUNTER — Ambulatory Visit (INDEPENDENT_AMBULATORY_CARE_PROVIDER_SITE_OTHER): Payer: BC Managed Care – PPO | Admitting: Obstetrics and Gynecology

## 2017-02-18 ENCOUNTER — Encounter: Payer: Self-pay | Admitting: Obstetrics and Gynecology

## 2017-02-18 VITALS — BP 114/72 | HR 92 | Resp 16 | Ht 65.0 in | Wt 130.0 lb

## 2017-02-18 DIAGNOSIS — Z01419 Encounter for gynecological examination (general) (routine) without abnormal findings: Secondary | ICD-10-CM | POA: Diagnosis not present

## 2017-02-18 MED ORDER — ESTRADIOL 0.1 MG/GM VA CREA: TOPICAL_CREAM | VAGINAL | 1 refills | Status: DC

## 2017-02-18 NOTE — Progress Notes (Signed)
56 y.o. G0P0 Married Caucasian female here for annual exam.    Has uterovaginal prolapse.  Feels like it is progressive.  Does not really use her pessary as it moves down with BMs. Up at night once per night.  Voiding well.  Sometimes leaks with cough, laugh or sneeze.  Uses a pad all the time. Not very sexually active. Intercourse painful.  Needs refill of vaginal estrogen cream.  Hx of postmenopausal bleeding.  Last episode of bleeding she thinks was fall in 2017 that was scant.  EMB 03/01/16 showed mildly proliferative endometrium.  Never took the Provera I prescribed.  Follow up EMB 11/21/16 showed atrophy.  Small fibroid on prior US.  Wants routine labs here today.   May retire in 4 years.  Caring for her mother who has cognitive decline.  Her uncle also has this issue.  Patient in concerned about her own future.  PCP:  Dr. Darcus Austin   Patient's last menstrual period was 11/28/2014.           Sexually active: Yes.    The current method of family planning is post menopausal status.    Exercising: No.  The patient does not participate in regular exercise at present. Smoker:  no  Health Maintenance: Pap:  02-25-15 Neg:Neg HR HPV; 02-19-12 Neg:Neg HR HPV History of abnormal Pap:  no MMG:  12/10/16 BIRADS 1 negative/density c Colonoscopy:  12/2012 normal;next due 2024 BMD:   n/a  Result  n/a TDaP:  2009 HIV and Hep C: 02/16/16 Negative Screening Labs: discuss today    reports that she has never smoked. She has never used smokeless tobacco. She reports that she does not drink alcohol or use drugs.  Past Medical History:  Diagnosis Date  . Chronic constipation     Past Surgical History:  Procedure Laterality Date  . CLEFT PALATE REPAIR  infant  . COSMETIC SURGERY Left    hand    Current Outpatient Prescriptions  Medication Sig Dispense Refill  . Ascorbic Acid (VITAMIN C) 100 MG tablet Take 100 mg by mouth daily.    . ASHWAGANDHA PO Take 1 tablet by mouth 2  (two) times daily.    Marland Kitchen aspirin 325 MG buffered tablet Take 325 mg by mouth as needed.     . B Complex Vitamins (VITAMIN B COMPLEX PO) Take 1 capsule by mouth daily.    . Calcium-Magnesium-Vitamin D (CALCIUM MAGNESIUM PO) Take by mouth. 1-2 caps daily    . Digestive Enzymes (SIMILASE PO) Take by mouth.    . estradiol (ESTRACE) 0.1 MG/GM vaginal cream Use 1/2 g vaginally every night for the first 2 weeks, then use 1/2 g vaginally at bed time twice a week. 42.5 g 2  . fish oil-omega-3 fatty acids 1000 MG capsule Take 2 g by mouth daily.    . Misc Natural Products (SUPER GREENS) POWD Take by mouth.    . Multiple Vitamin (MULTIVITAMIN) tablet Take 1 tablet by mouth daily.    Marland Kitchen OVER THE COUNTER MEDICATION daily. Thyroid Support 1-2 capsules    . OVER THE COUNTER MEDICATION daily. Adrenal Support 1-2 caps    . OVER THE COUNTER MEDICATION daily. Liver Support 1-2 capsules    . PROBIOTIC CAPS Take 1 capsule by mouth daily.    . TURMERIC PO Take 1 tablet by mouth 2 (two) times daily.    Marland Kitchen conjugated estrogens (PREMARIN) vaginal cream Use 1/2 gram (see applicator) vaginally every night for two weeks. Then decrease to twice  each week. (Patient not taking: Reported on 02/18/2017) 30 g 1   No current facility-administered medications for this visit.     Family History  Problem Relation Age of Onset  . Throat cancer Father        throat cancer  . Thyroid disease Father   . Hypertension Father   . Hyperlipidemia Father   . Thyroid disease Maternal Grandmother   . Osteoporosis Maternal Grandmother   . Heart disease Maternal Grandfather   . Cancer Paternal Grandmother   . Dementia Mother   . Thyroid disease Maternal Aunt   . Osteoporosis Maternal Aunt   . Lung cancer Maternal Uncle        lung cancer  . Heart disease Maternal Uncle   . Lung cancer Paternal Uncle        lung cancer  . Breast cancer Neg Hx     ROS:  Pertinent items are noted in HPI.  Otherwise, a comprehensive ROS was  negative.  Exam:   BP 114/72 (BP Location: Right Arm, Patient Position: Sitting, Cuff Size: Normal)   Pulse 92   Resp 16   Ht 5\' 5"  (1.651 m)   Wt 130 lb (59 kg)   LMP 11/28/2014 Comment: Spotting  BMI 21.63 kg/m     General appearance: alert, cooperative and appears stated age Head: Normocephalic, without obvious abnormality, atraumatic Neck: no adenopathy, supple, symmetrical, trachea midline and thyroid normal to inspection and palpation Lungs: clear to auscultation bilaterally Breasts: normal appearance, no masses or tenderness, No nipple retraction or dimpling, No nipple discharge or bleeding, No axillary or supraclavicular adenopathy Heart: regular rate and rhythm Abdomen: soft, non-tender; no masses, no organomegaly Extremities: extremities normal, atraumatic, no cyanosis or edema Skin: Skin color, texture, turgor normal. No rashes or lesions Lymph nodes: Cervical, supraclavicular, and axillary nodes normal. No abnormal inguinal nodes palpated Neurologic: Grossly normal  Pelvic: External genitalia:  no lesions              Urethra:  normal appearing urethra with no masses, tenderness or lesions              Bartholins and Skenes: normal                 Vagina: normal appearing vagina with normal color and discharge, no lesions.  First degree rectocele.               Cervix: no lesions              Pap taken: No. Bimanual Exam:  Uterus:  normal size, contour, position, consistency, mobility, non-tender              Adnexa: no mass, fullness, tenderness              Rectal exam: Yes.  .  Confirms.              Anus:  normal sphincter tone, no lesions  Chaperone was present for exam.  Assessment:   Well woman visit with normal exam. Vaginal atrophy.  PMB resolved and not recurrent. Rectocele.  Stress incontinence.   Plan: Mammogram screening discussed. Recommended self breast awareness. Pap and HR HPV as above. Guidelines for Calcium, Vitamin D, regular exercise  program including cardiovascular and weight bearing exercise. Routine labs.  Refill of vaginal estrogen cream.  We discussed dementia and resources available for her family.   Follow up annually and prn.   After visit summary provided.

## 2017-02-18 NOTE — Patient Instructions (Signed)

## 2017-02-20 LAB — COMPREHENSIVE METABOLIC PANEL
ALT: 20 IU/L (ref 0–32)
AST: 27 IU/L (ref 0–40)
Albumin/Globulin Ratio: 1.7 (ref 1.2–2.2)
Albumin: 4.3 g/dL (ref 3.5–5.5)
Alkaline Phosphatase: 68 IU/L (ref 39–117)
BUN/Creatinine Ratio: 13 (ref 9–23)
BUN: 10 mg/dL (ref 6–24)
Bilirubin Total: <0.2 mg/dL (ref 0.0–1.2)
CO2: 24 mmol/L (ref 20–29)
Calcium: 9.7 mg/dL (ref 8.7–10.2)
Chloride: 102 mmol/L (ref 96–106)
Creatinine, Ser: 0.76 mg/dL (ref 0.57–1.00)
GFR calc Af Amer: 103 mL/min/{1.73_m2} (ref >59)
GFR calc non Af Amer: 89 mL/min/{1.73_m2} (ref >59)
Globulin, Total: 2.6 g/dL (ref 1.5–4.5)
Glucose: 99 mg/dL (ref 65–99)
Potassium: 4.6 mmol/L (ref 3.5–5.2)
Sodium: 143 mmol/L (ref 134–144)
Total Protein: 6.9 g/dL (ref 6.0–8.5)

## 2017-02-20 LAB — LIPID PANEL
Chol/HDL Ratio: 2.2 ratio (ref 0.0–4.4)
Cholesterol, Total: 176 mg/dL (ref 100–199)
HDL: 81 mg/dL (ref >39)
LDL Calculated: 69 mg/dL (ref 0–99)
Triglycerides: 128 mg/dL (ref 0–149)
VLDL Cholesterol Cal: 26 mg/dL (ref 5–40)

## 2017-02-20 LAB — CBC
Hematocrit: 36.9 % (ref 34.0–46.6)
Hemoglobin: 12.4 g/dL (ref 11.1–15.9)
MCH: 28.6 pg (ref 26.6–33.0)
MCHC: 33.6 g/dL (ref 31.5–35.7)
MCV: 85 fL (ref 79–97)
Platelets: 279 10*3/uL (ref 150–379)
RBC: 4.33 x10E6/uL (ref 3.77–5.28)
RDW: 13.8 % (ref 12.3–15.4)
WBC: 6.5 10*3/uL (ref 3.4–10.8)

## 2017-02-20 LAB — TSH: TSH: 1.35 u[IU]/mL (ref 0.450–4.500)

## 2017-02-20 LAB — VITAMIN D 25 HYDROXY (VIT D DEFICIENCY, FRACTURES): Vit D, 25-Hydroxy: 38.2 ng/mL (ref 30.0–100.0)

## 2018-01-13 ENCOUNTER — Other Ambulatory Visit: Payer: Self-pay | Admitting: Family Medicine

## 2018-01-13 ENCOUNTER — Ambulatory Visit
Admission: RE | Admit: 2018-01-13 | Discharge: 2018-01-13 | Disposition: A | Discharge status: Home / Self Care | Payer: BC Managed Care – PPO | Source: Ambulatory Visit | Attending: Family Medicine | Admitting: Family Medicine

## 2018-01-13 DIAGNOSIS — M542 Cervicalgia: Secondary | ICD-10-CM

## 2018-02-14 ENCOUNTER — Other Ambulatory Visit: Payer: Self-pay | Admitting: Obstetrics and Gynecology

## 2018-02-14 DIAGNOSIS — Z1231 Encounter for screening mammogram for malignant neoplasm of breast: Secondary | ICD-10-CM

## 2018-02-19 ENCOUNTER — Ambulatory Visit
Admission: RE | Admit: 2018-02-19 | Discharge: 2018-02-19 | Disposition: A | Discharge status: Home / Self Care | Payer: BC Managed Care – PPO | Source: Ambulatory Visit | Attending: Obstetrics and Gynecology | Admitting: Obstetrics and Gynecology

## 2018-02-19 DIAGNOSIS — Z1231 Encounter for screening mammogram for malignant neoplasm of breast: Secondary | ICD-10-CM

## 2018-02-20 ENCOUNTER — Encounter: Payer: Self-pay | Admitting: Obstetrics and Gynecology

## 2018-02-20 ENCOUNTER — Ambulatory Visit (INDEPENDENT_AMBULATORY_CARE_PROVIDER_SITE_OTHER): Payer: BC Managed Care – PPO | Admitting: Obstetrics and Gynecology

## 2018-02-20 ENCOUNTER — Other Ambulatory Visit: Payer: Self-pay

## 2018-02-20 ENCOUNTER — Other Ambulatory Visit (HOSPITAL_COMMUNITY)
Admission: RE | Admit: 2018-02-20 | Discharge: 2018-02-20 | Disposition: A | Discharge status: Home / Self Care | Payer: BC Managed Care – PPO | Source: Ambulatory Visit | Attending: Obstetrics and Gynecology | Admitting: Obstetrics and Gynecology

## 2018-02-20 VITALS — BP 108/60 | HR 68 | Resp 14 | Ht 65.5 in | Wt 127.0 lb

## 2018-02-20 DIAGNOSIS — Z01419 Encounter for gynecological examination (general) (routine) without abnormal findings: Secondary | ICD-10-CM | POA: Insufficient documentation

## 2018-02-20 DIAGNOSIS — Z23 Encounter for immunization: Secondary | ICD-10-CM | POA: Diagnosis not present

## 2018-02-20 NOTE — Progress Notes (Signed)
57 y.o. G0P0 Married Caucasian female here for annual exam.    Some urinary incontinence with sneeze or cough in some instances.  Voids at night, once. Drinks mostly water.  Did not feel comfortable using vaginal estrogen cream.  Does not need refill. Has used olive oil or coconut oil.  Has a rectocele. No increased difficulty other than occasional issue.  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 Not working in the yard or exercising as much.   Mother living at home and has dementia and is 55 yo. She is able to be away from home for a week at a time to visit other family.  PCP: Dr. Horald Pollen    Patient's last menstrual period was 11/28/2014.           Sexually active: Yes.    The current method of family planning is post menopausal status.    Exercising: No.  The patient does not participate in regular exercise at present. Smoker:  no  Health Maintenance: Pap:  02/25/15 Neg:Neg HR HPV History of abnormal Pap:  no MMG:  02/19/18 BIRADS 1 negative/density b Colonoscopy:  12/2012 normal;next due 2024 BMD:   n/a  Result  n/a TDaP:  2009 Gardasil:   n/a HIV and Hep C: 02/16/16 Negative Screening Labs: discuss today   reports that she has never smoked. She has never used smokeless tobacco. She reports that she does not drink alcohol or use drugs.  Past Medical History:  Diagnosis Date  . Chronic constipation     Past Surgical History:  Procedure Laterality Date  . CLEFT PALATE REPAIR  infant  . COSMETIC SURGERY Left    hand    Current Outpatient Medications  Medication Sig Dispense Refill  . Ascorbic Acid (VITAMIN C) 100 MG tablet Take 100 mg by mouth daily.    . ASHWAGANDHA PO Take 1 tablet by mouth 2 (two) times daily.    Marland Kitchen aspirin 325 MG buffered tablet Take 325 mg by mouth as needed.     . B Complex Vitamins (VITAMIN B COMPLEX PO) Take 1 capsule by mouth daily.    . Calcium-Magnesium-Vitamin D (CALCIUM MAGNESIUM PO) Take by mouth. 1-2 caps daily    . Digestive  Enzymes (SIMILASE PO) Take by mouth.    . fish oil-omega-3 fatty acids 1000 MG capsule Take 2 g by mouth daily.    . Ginkgo Biloba 40 MG TABS Take 1 tablet by mouth daily.    Marland Kitchen GLUCOSAMINE-CHONDROITIN PO Take 2 tablets by mouth daily.    . Misc Natural Products (SUPER GREENS) POWD Take by mouth.    . Multiple Vitamin (MULTIVITAMIN) tablet Take 1 tablet by mouth daily.    . Multiple Vitamins-Minerals (PRESERVISION AREDS 2 PO) Take 1 capsule by mouth 2 (two) times daily.    Marland Kitchen OVER THE COUNTER MEDICATION daily. Thyroid Support 1-2 capsules    . OVER THE COUNTER MEDICATION daily. Adrenal Support 1-2 caps    . OVER THE COUNTER MEDICATION daily. Liver Support 1-2 capsules    . PROBIOTIC CAPS Take 1 capsule by mouth daily.    . TURMERIC PO Take 1 tablet by mouth 2 (two) times daily.    Marland Kitchen estradiol (ESTRACE) 0.1 MG/GM vaginal cream Use 1/2 g vaginally every night for the first 2 weeks, then use 1/2 g vaginally at bed time twice a week. (Patient not taking: Reported on 02/20/2018) 42.5 g 1   No current facility-administered medications for this visit.     Family History  Problem Relation Age of Onset  . Throat cancer Father        throat cancer  . Thyroid disease Father   . Hypertension Father   . Hyperlipidemia Father   . Thyroid disease Maternal Grandmother   . Osteoporosis Maternal Grandmother   . Heart disease Maternal Grandfather   . Cancer Paternal Grandmother   . Dementia Mother   . Thyroid disease Maternal Aunt   . Osteoporosis Maternal Aunt   . Lung cancer Maternal Uncle        lung cancer  . Heart disease Maternal Uncle   . Lung cancer Paternal Uncle        lung cancer  . Breast cancer Neg Hx     Review of Systems  Constitutional: Negative.   HENT: Positive for hearing loss.   Eyes: Negative.   Respiratory: Negative.   Cardiovascular: Negative.   Gastrointestinal: Negative.   Endocrine: Negative.   Genitourinary:       Pain with intercourse Loss of urine with sneeze  or cough Night urination  Musculoskeletal: Negative.   Skin: Negative.   Allergic/Immunologic: Negative.   Neurological: Negative.   Hematological: Negative.   Psychiatric/Behavioral: Negative.     Exam:   BP 108/60 (BP Location: Right Arm, Patient Position: Sitting, Cuff Size: Normal)   Pulse 68   Resp 14   Ht 5' 5.5" (1.664 m)   Wt 127 lb (57.6 kg)   LMP 11/28/2014 Comment: Spotting  BMI 20.81 kg/m     General appearance: alert, cooperative and appears stated age Head: Normocephalic, without obvious abnormality, atraumatic Neck: no adenopathy, supple, symmetrical, trachea midline and thyroid normal to inspection and palpation Lungs: clear to auscultation bilaterally Breasts: normal appearance, no masses or tenderness, No nipple retraction or dimpling, No nipple discharge or bleeding, No axillary or supraclavicular adenopathy Heart: regular rate and rhythm Abdomen: soft, non-tender; no masses, no organomegaly Extremities: extremities normal, atraumatic, no cyanosis or edema Skin: Skin color, texture, turgor normal. No rashes or lesions Lymph nodes: Cervical, supraclavicular, and axillary nodes normal. No abnormal inguinal nodes palpated Neurologic: Grossly normal  Pelvic: External genitalia:  no lesions              Urethra:  normal appearing urethra with no masses, tenderness or lesions              Bartholins and Skenes: normal                 Vagina: normal appearing vagina with normal color and discharge, no lesions.  First degree rectocele.  Good bladder support.               Cervix: no lesions              Pap taken: Yes.   Bimanual Exam:  Uterus:  normal size, contour, position, consistency, mobility, non-tender              Adnexa: no mass, fullness, tenderness              Rectal exam: Yes.  .  Confirms.              Anus:  normal sphincter tone, no lesions  Chaperone was present for exam.  Assessment:   Well woman visit with normal exam. Vaginal atrophy.   Rectocele.  Stress incontinence.   Plan: Mammogram screening. Recommended self breast awareness. Pap and HR HPV as above. Guidelines for Calcium, Vitamin D, regular exercise program including cardiovascular and weight bearing  exercise. We discussed use of the Estrace cream.  She will try this with what she has.  We reviewed effect on an existing breast cancer.  T Dap.  Routine labs. Kegel's and pelvic floor exercises.  Follow up annually and prn.   After visit summary provided.

## 2018-02-20 NOTE — Patient Instructions (Addendum)
EXERCISE AND DIET:  We recommended that you start or continue a regular exercise program for good health. Regular exercise means any activity that makes your heart beat faster and makes you sweat.  We recommend exercising at least 30 minutes per day at least 3 days a week, preferably 4 or 5.  We also recommend a diet low in fat and sugar.  Inactivity, poor dietary choices and obesity can cause diabetes, heart attack, stroke, and kidney damage, among others.    ALCOHOL AND SMOKING:  Women should limit their alcohol intake to no more than 7 drinks/beers/glasses of wine (combined, not each!) per week. Moderation of alcohol intake to this level decreases your risk of breast cancer and liver damage. And of course, no recreational drugs are part of a healthy lifestyle.  And absolutely no smoking or even second hand smoke. Most people know smoking can cause heart and lung diseases, but did you know it also contributes to weakening of your bones? Aging of your skin?  Yellowing of your teeth and nails?  CALCIUM AND VITAMIN D:  Adequate intake of calcium and Vitamin D are recommended.  The recommendations for exact amounts of these supplements seem to change often, but generally speaking 600 mg of calcium (either carbonate or citrate) and 800 units of Vitamin D per day seems prudent. Certain women may benefit from higher intake of Vitamin D.  If you are among these women, your doctor will have told you during your visit.    PAP SMEARS:  Pap smears, to check for cervical cancer or precancers,  have traditionally been done yearly, although recent scientific advances have shown that most women can have pap smears less often.  However, every woman still should have a physical exam from her gynecologist every year. It will include a breast check, inspection of the vulva and vagina to check for abnormal growths or skin changes, a visual exam of the cervix, and then an exam to evaluate the size and shape of the uterus and  ovaries.  And after 55 years of age, a rectal exam is indicated to check for rectal cancers. We will also provide age appropriate advice regarding health maintenance, like when you should have certain vaccines, screening for sexually transmitted diseases, bone density testing, colonoscopy, mammograms, etc.   MAMMOGRAMS:  All women over 40 years old should have a yearly mammogram. Many facilities now offer a "3D" mammogram, which may cost around $50 extra out of pocket. If possible,  we recommend you accept the option to have the 3D mammogram performed.  It both reduces the number of women who will be called back for extra views which then turn out to be normal, and it is better than the routine mammogram at detecting truly abnormal areas.    COLONOSCOPY:  Colonoscopy to screen for colon cancer is recommended for all women at age 50.  We know, you hate the idea of the prep.  We agree, BUT, having colon cancer and not knowing it is worse!!  Colon cancer so often starts as a polyp that can be seen and removed at colonscopy, which can quite literally save your life!  And if your first colonoscopy is normal and you have no family history of colon cancer, most women don't have to have it again for 10 years.  Once every ten years, you can do something that may end up saving your life, right?  We will be happy to help you get it scheduled when you are ready.    Be sure to check your insurance coverage so you understand how much it will cost.  It may be covered as a preventative service at no cost, but you should check your particular policy.      Kegel Exercises Kegel exercises help strengthen the muscles that support the rectum, vagina, small intestine, bladder, and uterus. Doing Kegel exercises can help:  Improve bladder and bowel control.  Improve sexual response.  Reduce problems and discomfort during pregnancy.  Kegel exercises involve squeezing your pelvic floor muscles, which are the same muscles you  squeeze when you try to stop the flow of urine. The exercises can be done while sitting, standing, or lying down, but it is best to vary your position. Phase 1 exercises 1. Squeeze your pelvic floor muscles tight. You should feel a tight lift in your rectal area. If you are a female, you should also feel a tightness in your vaginal area. Keep your stomach, buttocks, and legs relaxed. 2. Hold the muscles tight for up to 10 seconds. 3. Relax your muscles. Repeat this exercise 50 times a day or as many times as told by your health care provider. Continue to do this exercise for at least 4-6 weeks or for as long as told by your health care provider. This information is not intended to replace advice given to you by your health care provider. Make sure you discuss any questions you have with your health care provider. Document Released: 07/02/2012 Document Revised: 03/10/2016 Document Reviewed: 06/05/2015 Elsevier Interactive Patient Education  2018 [employer].   Pelvic Floor Exercises for Bowel Control  Exercises using both the external anal sphincter and the deep pelvic floor muscles can help you to improve your bowel control. When done correctly, these exercises can tone and strengthen the muscles to help you hold back gas and prevent fecal incontinence (leakage of stool). Exercise programs take time; you may not see any noticeable change in your bowel control immediately.  In some cases it may take several months to regain control.  Bowel Control Muscles The anus and the anal canal, has rings of muscle around it. The outer ring of muscle is called the external anal sphincter; it is a voluntary muscle which you can learn to tighten and close more efficiently. When you contract it you will feel the skin around your anus tighten and pull in as if the anus is winking. Try to keep the buttocks muscles relaxed. The inner ring around the anus is the internal anal sphincter. It is an involuntary and  automatic muscle; you don't have to think to keep it closed or open.  This muscle should be closed at all times, except when you are actually trying to have a bowel movement.  In addition to the sphincter muscles, there are deeper muscles called the levator ani that form a sling from your tailbone to your pubic bone. The levator ani muscle has a specific part called the puborectalis that holds stool in until you give the signal to relax and empty.  When you contract these muscles it creates a feeling of lifting the anus inward.  External Anal Sphincter        Levator ani deep layer   Effective Exercises for Control of Gas and Bowels . Identify the specific areas of the pelvic floor muscles you need to use.  This can be done using a mirror to see if you are contracting the correct muscles or by placing the pad of your finger at or just  inside the anal opening. . Develop an exercise plan for strength, endurance and quick response of the muscles and stick with it.  You must make the muscles do more than they are used to doing. . Incorporate the exercises into your daily activities.   2007, [employer] Doc.36

## 2018-02-21 LAB — CBC
Hematocrit: 37.8 % (ref 34.0–46.6)
Hemoglobin: 12.3 g/dL (ref 11.1–15.9)
MCH: 28.7 pg (ref 26.6–33.0)
MCHC: 32.5 g/dL (ref 31.5–35.7)
MCV: 88 fL (ref 79–97)
Platelets: 272 10*3/uL (ref 150–450)
RBC: 4.29 x10E6/uL (ref 3.77–5.28)
RDW: 13.9 % (ref 12.3–15.4)
WBC: 6.4 10*3/uL (ref 3.4–10.8)

## 2018-02-21 LAB — LIPID PANEL
Chol/HDL Ratio: 2.4 ratio (ref 0.0–4.4)
Cholesterol, Total: 182 mg/dL (ref 100–199)
HDL: 77 mg/dL (ref >39)
LDL Calculated: 71 mg/dL (ref 0–99)
Triglycerides: 169 mg/dL — ABNORMAL HIGH (ref 0–149)
VLDL Cholesterol Cal: 34 mg/dL (ref 5–40)

## 2018-02-21 LAB — COMPREHENSIVE METABOLIC PANEL
ALT: 22 IU/L (ref 0–32)
AST: 24 IU/L (ref 0–40)
Albumin/Globulin Ratio: 1.8 (ref 1.2–2.2)
Albumin: 4.4 g/dL (ref 3.5–5.5)
Alkaline Phosphatase: 69 IU/L (ref 39–117)
BUN/Creatinine Ratio: 15 (ref 9–23)
BUN: 13 mg/dL (ref 6–24)
Bilirubin Total: <0.2 mg/dL (ref 0.0–1.2)
CO2: 26 mmol/L (ref 20–29)
Calcium: 9.8 mg/dL (ref 8.7–10.2)
Chloride: 103 mmol/L (ref 96–106)
Creatinine, Ser: 0.85 mg/dL (ref 0.57–1.00)
GFR calc Af Amer: 89 mL/min/{1.73_m2} (ref >59)
GFR calc non Af Amer: 77 mL/min/{1.73_m2} (ref >59)
Globulin, Total: 2.5 g/dL (ref 1.5–4.5)
Glucose: 102 mg/dL — ABNORMAL HIGH (ref 65–99)
Potassium: 3.9 mmol/L (ref 3.5–5.2)
Sodium: 136 mmol/L (ref 134–144)
Total Protein: 6.9 g/dL (ref 6.0–8.5)

## 2018-02-21 LAB — CYTOLOGY - PAP
Diagnosis: NEGATIVE
HPV: NOT DETECTED

## 2018-02-21 LAB — TSH: TSH: 0.883 u[IU]/mL (ref 0.450–4.500)

## 2018-02-21 LAB — VITAMIN D 25 HYDROXY (VIT D DEFICIENCY, FRACTURES): Vit D, 25-Hydroxy: 37.5 ng/mL (ref 30.0–100.0)

## 2019-02-23 ENCOUNTER — Other Ambulatory Visit: Payer: Self-pay

## 2019-02-25 ENCOUNTER — Other Ambulatory Visit: Payer: Self-pay

## 2019-02-25 ENCOUNTER — Encounter: Payer: Self-pay | Admitting: Obstetrics and Gynecology

## 2019-02-25 ENCOUNTER — Ambulatory Visit: Payer: BC Managed Care – PPO | Admitting: Obstetrics and Gynecology

## 2019-02-25 VITALS — BP 102/60 | HR 80 | Temp 98.0°F | Resp 12 | Ht 65.25 in | Wt 123.8 lb

## 2019-02-25 DIAGNOSIS — Z01419 Encounter for gynecological examination (general) (routine) without abnormal findings: Secondary | ICD-10-CM | POA: Diagnosis not present

## 2019-02-25 NOTE — Progress Notes (Signed)
58 y.o. G0P0 Married Caucasian female here for annual exam.    No increased symptoms from rectocele or urinary incontinence.  Manageable per patient.  Wears panty liners.   Not using vaginal estrogen very much.  Does not need refill.   Mother with dementia and fell.  She is living at patient's home.   PCP: Horald Pollen, MD    Patient's last menstrual period was 11/28/2014.           Sexually active: Yes.    The current method of family planning is post menopausal status.    Exercising: No.  The patient does not participate in regular exercise at present. Smoker:  no  Health Maintenance: Pap:  02/20/18 Neg:Neg HR HPV History of abnormal Pap:  no MMG:  02/19/18 BIRADS 1 negative/density b Colonoscopy:  12/2012 normal;next due 2024 TDaP:  02/20/18 HIV and Hep C: 02/16/16 Negative Screening Labs: discuss today   reports that she has never smoked. She has never used smokeless tobacco. She reports that she does not drink alcohol or use drugs.  Past Medical History:  Diagnosis Date  . Chronic constipation     Past Surgical History:  Procedure Laterality Date  . CLEFT PALATE REPAIR  infant  . COSMETIC SURGERY Left    hand    Current Outpatient Medications  Medication Sig Dispense Refill  . Ascorbic Acid (VITAMIN C) 100 MG tablet Take 100 mg by mouth daily.    . ASHWAGANDHA PO Take 1 tablet by mouth 2 (two) times daily.    Marland Kitchen aspirin 325 MG buffered tablet Take 325 mg by mouth as needed.     . B Complex Vitamins (VITAMIN B COMPLEX PO) Take 1 capsule by mouth daily.    . Calcium-Magnesium-Vitamin D (CALCIUM MAGNESIUM PO) Take by mouth. 1-2 caps daily    . Digestive Enzymes (SIMILASE PO) Take by mouth.    . estradiol (ESTRACE) 0.1 MG/GM vaginal cream Use 1/2 g vaginally every night for the first 2 weeks, then use 1/2 g vaginally at bed time twice a week. 42.5 g 1  . fish oil-omega-3 fatty acids 1000 MG capsule Take 2 g by mouth daily.    . Ginkgo Biloba 40 MG TABS Take 1 tablet  by mouth daily.    Marland Kitchen GLUCOSAMINE-CHONDROITIN PO Take 2 tablets by mouth daily.    . Misc Natural Products (SUPER GREENS) POWD Take by mouth.    . Multiple Vitamin (MULTIVITAMIN) tablet Take 1 tablet by mouth daily.    . Multiple Vitamins-Minerals (PRESERVISION AREDS 2 PO) Take 1 capsule by mouth 2 (two) times daily.    Marland Kitchen OVER THE COUNTER MEDICATION daily. Thyroid Support 1-2 capsules    . OVER THE COUNTER MEDICATION daily. Adrenal Support 1-2 caps    . OVER THE COUNTER MEDICATION daily. Liver Support 1-2 capsules    . PROBIOTIC CAPS Take 1 capsule by mouth daily.    . TURMERIC PO Take 1 tablet by mouth 2 (two) times daily.     No current facility-administered medications for this visit.     Family History  Problem Relation Age of Onset  . Throat cancer Father        throat cancer  . Thyroid disease Father   . Hypertension Father   . Hyperlipidemia Father   . Thyroid disease Maternal Grandmother   . Osteoporosis Maternal Grandmother   . Heart disease Maternal Grandfather   . Cancer Paternal Grandmother   . Dementia Mother   . Thyroid disease Maternal  Aunt   . Osteoporosis Maternal Aunt   . Lung cancer Maternal Uncle        lung cancer  . Heart disease Maternal Uncle   . Lung cancer Paternal Uncle        lung cancer  . Breast cancer Neg Hx     Review of Systems  Constitutional: Negative.   HENT: Negative.   Eyes: Negative.   Respiratory: Negative.   Cardiovascular: Negative.   Gastrointestinal: Negative.   Endocrine: Negative.   Genitourinary: Negative.   Musculoskeletal: Negative.   Skin: Negative.   Allergic/Immunologic: Negative.   Neurological: Negative.   Hematological: Negative.   Psychiatric/Behavioral: Negative.     Exam:   BP 102/60 (BP Location: Right Arm, Patient Position: Sitting, Cuff Size: Normal)   Pulse 80   Temp 98 F (36.7 C) (Temporal)   Resp 12   Ht 5' 5.25" (1.657 m)   Wt 123 lb 12.8 oz (56.2 kg)   LMP 11/28/2014 Comment: Spotting  BMI  20.44 kg/m     General appearance: alert, cooperative and appears stated age Head: normocephalic, without obvious abnormality, atraumatic Neck: no adenopathy, supple, symmetrical, trachea midline and thyroid normal to inspection and palpation Lungs: clear to auscultation bilaterally Breasts: normal appearance, no masses or tenderness, No nipple retraction or dimpling, No nipple discharge or bleeding, No axillary adenopathy Heart: regular rate and rhythm Abdomen: soft, non-tender; no masses, no organomegaly Extremities: extremities normal, atraumatic, no cyanosis or edema Skin: skin color, texture, turgor normal. No rashes or lesions Lymph nodes: cervical, supraclavicular, and axillary nodes normal. Neurologic: grossly normal  Pelvic: External genitalia:  no lesions              No abnormal inguinal nodes palpated.              Urethra:  normal appearing urethra with no masses, tenderness or lesions              Bartholins and Skenes: normal                 Vagina: normal appearing vagina with normal color and discharge, no lesions.  First degree rectocele.  Good Kegel.              Cervix: no lesions              Pap taken: No. Bimanual Exam:  Uterus:  normal size, contour, position, consistency, mobility, non-tender              Adnexa: no mass, fullness, tenderness              Rectal exam: Yes.  .  Confirms.              Anus:  normal sphincter tone, no lesions  Chaperone was present for exam.  Assessment:   Well woman visit with normal exam. Vaginal atrophy. Rectocele.  Stress incontinence.  Plan: Mammogram screening discussed.  She will schedule.  Self breast awareness reviewed. Pap and HR HPV as above. Guidelines for Calcium, Vitamin D, regular exercise program including cardiovascular and weight bearing exercise. Routine labs.  Follow up annually and prn.   After visit summary provided.

## 2019-02-25 NOTE — Patient Instructions (Signed)

## 2019-02-26 LAB — CBC
Hematocrit: 35.6 % (ref 34.0–46.6)
Hemoglobin: 12.1 g/dL (ref 11.1–15.9)
MCH: 29.4 pg (ref 26.6–33.0)
MCHC: 34 g/dL (ref 31.5–35.7)
MCV: 87 fL (ref 79–97)
Platelets: 260 10*3/uL (ref 150–450)
RBC: 4.11 x10E6/uL (ref 3.77–5.28)
RDW: 12.7 % (ref 11.7–15.4)
WBC: 5.1 10*3/uL (ref 3.4–10.8)

## 2019-02-26 LAB — LIPID PANEL
Chol/HDL Ratio: 1.9 ratio (ref 0.0–4.4)
Cholesterol, Total: 166 mg/dL (ref 100–199)
HDL: 88 mg/dL (ref >39)
LDL Calculated: 63 mg/dL (ref 0–99)
Triglycerides: 74 mg/dL (ref 0–149)
VLDL Cholesterol Cal: 15 mg/dL (ref 5–40)

## 2019-02-26 LAB — COMPREHENSIVE METABOLIC PANEL
ALT: 19 IU/L (ref 0–32)
AST: 23 IU/L (ref 0–40)
Albumin/Globulin Ratio: 2 (ref 1.2–2.2)
Albumin: 4.5 g/dL (ref 3.8–4.9)
Alkaline Phosphatase: 71 IU/L (ref 39–117)
BUN/Creatinine Ratio: 19 (ref 9–23)
BUN: 16 mg/dL (ref 6–24)
Bilirubin Total: 0.4 mg/dL (ref 0.0–1.2)
CO2: 28 mmol/L (ref 20–29)
Calcium: 9.5 mg/dL (ref 8.7–10.2)
Chloride: 100 mmol/L (ref 96–106)
Creatinine, Ser: 0.83 mg/dL (ref 0.57–1.00)
GFR calc Af Amer: 91 mL/min/{1.73_m2} (ref >59)
GFR calc non Af Amer: 79 mL/min/{1.73_m2} (ref >59)
Globulin, Total: 2.2 g/dL (ref 1.5–4.5)
Glucose: 79 mg/dL (ref 65–99)
Potassium: 4.5 mmol/L (ref 3.5–5.2)
Sodium: 139 mmol/L (ref 134–144)
Total Protein: 6.7 g/dL (ref 6.0–8.5)

## 2019-03-05 ENCOUNTER — Encounter

## 2019-03-05 ENCOUNTER — Ambulatory Visit: Payer: BC Managed Care – PPO | Admitting: Obstetrics and Gynecology

## 2020-02-17 ENCOUNTER — Other Ambulatory Visit: Payer: Self-pay | Admitting: Obstetrics and Gynecology

## 2020-02-17 DIAGNOSIS — Z1231 Encounter for screening mammogram for malignant neoplasm of breast: Secondary | ICD-10-CM

## 2020-02-23 ENCOUNTER — Other Ambulatory Visit: Payer: Self-pay

## 2020-02-23 ENCOUNTER — Ambulatory Visit
Admission: RE | Admit: 2020-02-23 | Discharge: 2020-02-23 | Disposition: A | Discharge status: Home / Self Care | Payer: BC Managed Care – PPO | Source: Ambulatory Visit | Attending: Obstetrics and Gynecology | Admitting: Obstetrics and Gynecology

## 2020-02-23 DIAGNOSIS — Z1231 Encounter for screening mammogram for malignant neoplasm of breast: Secondary | ICD-10-CM

## 2020-02-24 NOTE — Progress Notes (Signed)
59 y.o. G0P0 Married Caucasian female here for annual exam.    States she had some vaginal bleeding a couple of months ago, Dec 28, 2019. No known hemorrhoids.  (She did show me a picture on her cell phone.)  Does have a rectocele and she does have to strain with this.  Using fiber and this controls her BMs. She is satisfied with her control of this.  Doing ok with her bladder control.  Not using vaginal estrogen cream. Intercourse can be painful.  Mother living at home with them.   Patient is her caregiver. She goes to memory care during the afternoon.   Completed her Covid vaccine.   PCP:  Horald Pollen, MD   Patient's last menstrual period was 11/28/2014.           Sexually active: Yes.    The current method of family planning is post menopausal status.    Exercising: No.  The patient does not participate in regular exercise at present. Smoker:  no  Health Maintenance: Pap: 02/20/18 Neg:Neg HR HPV, 02-25-15 Neg:Neg HR HPV, 02-19-12 Neg History of abnormal Pap:  no MMG: 02-23-20 3D/Neg/density B/Birads1 Colonoscopy: 12/2012 normal;next due 2024 BMD:   n/a  Result  n/a TDaP:  02-20-18 Gardasil:   no HIV:02-16-16 NR Hep C:02-16-16 Neg Screening Labs:  Today.    reports that she has never smoked. She has never used smokeless tobacco. She reports that she does not drink alcohol and does not use drugs.  Past Medical History:  Diagnosis Date  . Chronic constipation     Past Surgical History:  Procedure Laterality Date  . CLEFT PALATE REPAIR  infant  . COSMETIC SURGERY Left    hand    Current Outpatient Medications  Medication Sig Dispense Refill  . Ascorbic Acid (VITAMIN C) 100 MG tablet Take 100 mg by mouth daily.    Marland Kitchen aspirin 325 MG buffered tablet Take 325 mg by mouth as needed.     . B Complex Vitamins (VITAMIN B COMPLEX PO) Take 1 capsule by mouth daily.    . Calcium-Magnesium-Vitamin D (CALCIUM MAGNESIUM PO) Take by mouth. 1-2 caps daily    . Digestive Enzymes  (SIMILASE PO) Take by mouth.    . estradiol (ESTRACE) 0.1 MG/GM vaginal cream Use 1/2 g vaginally every night for the first 2 weeks, then use 1/2 g vaginally at bed time twice a week. (Patient not taking: Reported on 02/29/2020) 42.5 g 1  . fish oil-omega-3 fatty acids 1000 MG capsule Take 2 g by mouth daily.    . Ginkgo Biloba 40 MG TABS Take 1 tablet by mouth daily.    Marland Kitchen GLUCOSAMINE-CHONDROITIN PO Take 2 tablets by mouth daily.    . Misc Natural Products (SUPER GREENS) POWD Take by mouth.    . Multiple Vitamin (MULTIVITAMIN) tablet Take 1 tablet by mouth daily.    . Multiple Vitamins-Minerals (PRESERVISION AREDS 2 PO) Take 1 capsule by mouth 2 (two) times daily.    Marland Kitchen OVER THE COUNTER MEDICATION daily. Thyroid Support 1-2 capsules    . OVER THE COUNTER MEDICATION daily. Liver Support 1-2 capsules    . PROBIOTIC CAPS Take 1 capsule by mouth daily.    . TURMERIC PO Take 1 tablet by mouth 2 (two) times daily.     No current facility-administered medications for this visit.    Family History  Problem Relation Age of Onset  . Throat cancer Father        throat cancer  .  Thyroid disease Father   . Hypertension Father   . Hyperlipidemia Father   . Thyroid disease Maternal Grandmother   . Osteoporosis Maternal Grandmother   . Heart disease Maternal Grandfather   . Cancer Paternal Grandmother   . Dementia Mother   . Thyroid disease Maternal Aunt   . Osteoporosis Maternal Aunt   . Lung cancer Maternal Uncle        lung cancer  . Heart disease Maternal Uncle   . Lung cancer Paternal Uncle        lung cancer  . Breast cancer Neg Hx     Review of Systems  All other systems reviewed and are negative.   Exam:   BP 104/62   Pulse 64   Resp 18   Ht 5\' 5"  (1.651 m)   Wt 129 lb (58.5 kg)   LMP 11/28/2014 Comment: Spotting  BMI 21.47 kg/m     General appearance: alert, cooperative and appears stated age Head: normocephalic, without obvious abnormality, atraumatic Neck: no adenopathy,  supple, symmetrical, trachea midline and thyroid normal to inspection and palpation Lungs: clear to auscultation bilaterally Breasts: normal appearance, no masses or tenderness, No nipple retraction or dimpling, No nipple discharge or bleeding, No axillary adenopathy Heart: regular rate and rhythm Abdomen: soft, non-tender; no masses, no organomegaly Extremities: extremities normal, atraumatic, no cyanosis or edema Skin: skin color, texture, turgor normal. No rashes or lesions Lymph nodes: cervical, supraclavicular, and axillary nodes normal. Neurologic: grossly normal  Pelvic: External genitalia:  no lesions              No abnormal inguinal nodes palpated.              Urethra:  normal appearing urethra with no masses, tenderness or lesions              Bartholins and Skenes: normal                 Vagina: normal appearing vagina with normal color and discharge, no lesions              Cervix: no lesions              Pap taken: No. Bimanual Exam:  Uterus:  normal size, contour, position, consistency, mobility, non-tender.  First degree uterine prolapse.   Almost second degree rectocele with a strain.               Adnexa: no mass, fullness, tenderness              Rectal exam: Yes.  .  Confirms.              Anus:  normal sphincter tone, no lesions  Chaperone was present for exam.  Assessment:   Well woman visit with normal exam. Postmenopausal bleeding.  Vaginal atrophy. Rectocele and uterine prolapse.   Stress incontinence. Caregiver status.   Plan: Mammogram screening discussed. Self breast awareness reviewed. Pap and HR HPV as above.  New guidelines discussed. Guidelines for Calcium, Vitamin D, regular exercise program including cardiovascular and weight bearing exercise. Routine labs.  Return for pelvic US and possible EMB.   We discussed potential causes of postmenopausal bleeding.  Atrophy, polyp, malignancies. Refill for vaginal estrogen, which she will only start  using after her work up for bleeding is complete.  We discussed potential effect on breast cancer.  Support given for caregiver status.  Follow up annually and prn.   After visit summary provided.

## 2020-02-29 ENCOUNTER — Encounter: Payer: Self-pay | Admitting: Obstetrics and Gynecology

## 2020-02-29 ENCOUNTER — Ambulatory Visit: Payer: BC Managed Care – PPO | Admitting: Obstetrics and Gynecology

## 2020-02-29 ENCOUNTER — Other Ambulatory Visit: Payer: Self-pay

## 2020-02-29 ENCOUNTER — Telehealth: Payer: Self-pay | Admitting: Obstetrics and Gynecology

## 2020-02-29 VITALS — BP 104/62 | HR 64 | Resp 18 | Ht 65.0 in | Wt 129.0 lb

## 2020-02-29 DIAGNOSIS — E870 Hyperosmolality and hypernatremia: Secondary | ICD-10-CM | POA: Diagnosis not present

## 2020-02-29 DIAGNOSIS — N950 Postmenopausal bleeding: Secondary | ICD-10-CM | POA: Diagnosis not present

## 2020-02-29 DIAGNOSIS — Z01419 Encounter for gynecological examination (general) (routine) without abnormal findings: Secondary | ICD-10-CM

## 2020-02-29 MED ORDER — ESTRADIOL 0.1 MG/GM VA CREA: TOPICAL_CREAM | VAGINAL | 1 refills | Status: DC

## 2020-02-29 NOTE — Telephone Encounter (Signed)
Spoke with patient regarding benefits for recommended ultrasound and endometrial biopsy. Patient is aware that ultrasound is transvaginal. Patient acknowledges understanding of information presented. Patient is aware of cancellation policy. Patient scheduled appointment for 03/03/2020 at 1000AM with [place]. Quincy Simmonds, MD, Cherlynn June. Encounter closed.

## 2020-02-29 NOTE — Patient Instructions (Signed)

## 2020-03-01 LAB — VITAMIN D 25 HYDROXY (VIT D DEFICIENCY, FRACTURES): Vit D, 25-Hydroxy: 60.2 ng/mL (ref 30.0–100.0)

## 2020-03-01 LAB — LIPID PANEL
Chol/HDL Ratio: 2.2 ratio (ref 0.0–4.4)
Cholesterol, Total: 195 mg/dL (ref 100–199)
HDL: 90 mg/dL (ref >39)
LDL Chol Calc (NIH): 91 mg/dL (ref 0–99)
Triglycerides: 80 mg/dL (ref 0–149)
VLDL Cholesterol Cal: 14 mg/dL (ref 5–40)

## 2020-03-01 LAB — COMPREHENSIVE METABOLIC PANEL
ALT: 18 IU/L (ref 0–32)
AST: 22 IU/L (ref 0–40)
Albumin/Globulin Ratio: 1.6 (ref 1.2–2.2)
Albumin: 4.4 g/dL (ref 3.8–4.9)
Alkaline Phosphatase: 63 IU/L (ref 48–121)
BUN/Creatinine Ratio: 20 (ref 9–23)
BUN: 16 mg/dL (ref 6–24)
Bilirubin Total: 0.2 mg/dL (ref 0.0–1.2)
CO2: 30 mmol/L — ABNORMAL HIGH (ref 20–29)
Calcium: 10.1 mg/dL (ref 8.7–10.2)
Chloride: 103 mmol/L (ref 96–106)
Creatinine, Ser: 0.79 mg/dL (ref 0.57–1.00)
GFR calc Af Amer: 96 mL/min/{1.73_m2} (ref >59)
GFR calc non Af Amer: 83 mL/min/{1.73_m2} (ref >59)
Globulin, Total: 2.7 g/dL (ref 1.5–4.5)
Glucose: 78 mg/dL (ref 65–99)
Potassium: 4.8 mmol/L (ref 3.5–5.2)
Sodium: 145 mmol/L — ABNORMAL HIGH (ref 134–144)
Total Protein: 7.1 g/dL (ref 6.0–8.5)

## 2020-03-01 LAB — CBC
Hematocrit: 37.1 % (ref 34.0–46.6)
Hemoglobin: 12.4 g/dL (ref 11.1–15.9)
MCH: 29.5 pg (ref 26.6–33.0)
MCHC: 33.4 g/dL (ref 31.5–35.7)
MCV: 88 fL (ref 79–97)
Platelets: 273 10*3/uL (ref 150–450)
RBC: 4.21 x10E6/uL (ref 3.77–5.28)
RDW: 12.2 % (ref 11.7–15.4)
WBC: 6.3 10*3/uL (ref 3.4–10.8)

## 2020-03-02 NOTE — Addendum Note (Signed)
Addended by: Yisroel Ramming, Remington Skalsky E on: 03/02/2020 12:59 PM   Modules accepted: Orders

## 2020-03-03 ENCOUNTER — Ambulatory Visit: Payer: BC Managed Care – PPO | Admitting: Obstetrics and Gynecology

## 2020-03-03 ENCOUNTER — Other Ambulatory Visit: Payer: Self-pay

## 2020-03-03 ENCOUNTER — Other Ambulatory Visit (HOSPITAL_COMMUNITY)
Admission: RE | Admit: 2020-03-03 | Discharge: 2020-03-03 | Disposition: A | Discharge status: Home / Self Care | Payer: BC Managed Care – PPO | Source: Ambulatory Visit | Attending: Obstetrics and Gynecology | Admitting: Obstetrics and Gynecology

## 2020-03-03 ENCOUNTER — Encounter: Payer: Self-pay | Admitting: Obstetrics and Gynecology

## 2020-03-03 ENCOUNTER — Ambulatory Visit (INDEPENDENT_AMBULATORY_CARE_PROVIDER_SITE_OTHER): Payer: BC Managed Care – PPO

## 2020-03-03 DIAGNOSIS — N950 Postmenopausal bleeding: Secondary | ICD-10-CM | POA: Diagnosis present

## 2020-03-03 NOTE — Progress Notes (Signed)
GYNECOLOGY  VISIT   HPI: 59 y.o.   Married  Caucasian  female   G0P0 with Patient's last menstrual period was 11/28/2014.   here for pelvic ultrasound and possible endometrial biopsy for postmenopausal bleeding that occurred on Dec 28, 2019.  She states that she has bleeding once or twice a year for the last 5 years.  It is always just a spot or two on her underwear, but not on her tissue with wiping.  No bleeding with intercourse.   She is not 341% certain if it is coming from the rectum.   She did have postmenopausal bleeding evaluation in 2017 and 2018.  She had an EMB showing weakly proliferative endometrium. She was treated with some Provera (did not complete the entire course) and had a follow up endometrial biopsy which was then benign and showing atrophy.  GYNECOLOGIC HISTORY: Patient's last menstrual period was 11/28/2014. Contraception: PMP Menopausal hormone therapy: Estrace cream Last mammogram: 02-23-20 3D/Neg/density B/Birads1 Last pap smear: 02/20/18 Neg:Neg HR HPV, 02-25-15 Neg:Neg HR HPV, 02-19-12 Neg         OB History    Gravida  0   Para      Term      Preterm      AB      Living        SAB      TAB      Ectopic      Multiple      Live Births                 There are no problems to display for this patient.   Past Medical History:  Diagnosis Date  . Chronic constipation     Past Surgical History:  Procedure Laterality Date  . CLEFT PALATE REPAIR  infant  . COSMETIC SURGERY Left    hand    Current Outpatient Medications  Medication Sig Dispense Refill  . Ascorbic Acid (VITAMIN C) 100 MG tablet Take 100 mg by mouth daily.    Marland Kitchen aspirin 325 MG buffered tablet Take 325 mg by mouth as needed.     . B Complex Vitamins (VITAMIN B COMPLEX PO) Take 1 capsule by mouth daily.    . Calcium-Magnesium-Vitamin D (CALCIUM MAGNESIUM PO) Take by mouth. 1-2 caps daily    . Digestive Enzymes (SIMILASE PO) Take by mouth.    . estradiol (ESTRACE)  0.1 MG/GM vaginal cream Use 1/2 g vaginally every night for the first 2 weeks, then use 1/2 g vaginally at bed time twice a week. 42.5 g 1  . fish oil-omega-3 fatty acids 1000 MG capsule Take 2 g by mouth daily.    . Ginkgo Biloba 40 MG TABS Take 1 tablet by mouth daily.    Marland Kitchen GLUCOSAMINE-CHONDROITIN PO Take 2 tablets by mouth daily.    . Misc Natural Products (SUPER GREENS) POWD Take by mouth.    . Multiple Vitamin (MULTIVITAMIN) tablet Take 1 tablet by mouth daily.    . Multiple Vitamins-Minerals (PRESERVISION AREDS 2 PO) Take 1 capsule by mouth 2 (two) times daily.    Marland Kitchen OVER THE COUNTER MEDICATION daily. Thyroid Support 1-2 capsules    . OVER THE COUNTER MEDICATION daily. Liver Support 1-2 capsules    . PROBIOTIC CAPS Take 1 capsule by mouth daily.    . TURMERIC PO Take 1 tablet by mouth 2 (two) times daily.     No current facility-administered medications for this visit.     ALLERGIES: Penicillins,  Augmentin [amoxicillin-pot clavulanate], Dairy aid [lactase], and Eggs or egg-derived products  Family History  Problem Relation Age of Onset  . Throat cancer Father        throat cancer  . Thyroid disease Father   . Hypertension Father   . Hyperlipidemia Father   . Thyroid disease Maternal Grandmother   . Osteoporosis Maternal Grandmother   . Heart disease Maternal Grandfather   . Cancer Paternal Grandmother   . Dementia Mother   . Thyroid disease Maternal Aunt   . Osteoporosis Maternal Aunt   . Lung cancer Maternal Uncle        lung cancer  . Heart disease Maternal Uncle   . Lung cancer Paternal Uncle        lung cancer  . Breast cancer Neg Hx     Social History   Socioeconomic History  . Marital status: Married    Spouse name: Not on file  . Number of children: Not on file  . Years of education: Not on file  . Highest education level: Not on file  Occupational History  . Not on file  Tobacco Use  . Smoking status: Never Smoker  . Smokeless tobacco: Never Used    Vaping Use  . Vaping Use: Never used  Substance and Sexual Activity  . Alcohol use: No    Alcohol/week: 0.0 standard drinks  . Drug use: No  . Sexual activity: Yes    Partners: Male    Birth control/protection: Post-menopausal  Other Topics Concern  . Not on file  Social History Narrative  . Not on file   Social Determinants of Health   Financial Resource Strain:   . Difficulty of Paying Living Expenses:   Food Insecurity:   . Worried About Charity fundraiser in the Last Year:   . Arboriculturist in the Last Year:   Transportation Needs:   . Film/video editor (Medical):   Marland Kitchen Lack of Transportation (Non-Medical):   Physical Activity:   . Days of Exercise per Week:   . Minutes of Exercise per Session:   Stress:   . Feeling of Stress :   Social Connections:   . Frequency of Communication with Friends and Family:   . Frequency of Social Gatherings with Friends and Family:   . Attends Religious Services:   . Active Member of Clubs or Organizations:   . Attends Archivist Meetings:   Marland Kitchen Marital Status:   Intimate Partner Violence:   . Fear of Current or Ex-Partner:   . Emotionally Abused:   Marland Kitchen Physically Abused:   . Sexually Abused:     Review of Systems  All other systems reviewed and are negative.   PHYSICAL EXAMINATION:    BP 120/66   Pulse 60   Ht 5\' 5"  (1.651 m)   Wt 129 lb (58.5 kg)   LMP 11/28/2014 Comment: Spotting  BMI 21.47 kg/m     General appearance: alert, cooperative and appears stated age  Pelvic: External genitalia:  no lesions              Urethra:  normal appearing urethra with no masses, tenderness or lesions              Bartholins and Skenes: normal                 Vagina: normal appearing vagina with normal color and discharge, atrophy on exam.  Cervix: no lesions                Bimanual Exam:  Uterus:  normal size, contour, position, consistency, mobility, non-tender              Adnexa: no mass, fullness,  tenderness           Pelvic US Uterus with 0.75 cm fibroid.  EMS 1.38 mm. Ovaries atrophic.  No free fluid.   EMB  Consent done.  Hibiclens prep. Local 1% lidocaine - lot [id number], exp 06/29/20. Pipelle passed sterilely to 6 cm x 2.  Tissue to pathology.  No complications. Minimal EBL.  Chaperone was present for exam.  ASSESSMENT  Recurrent postmenopausal bleeding.  Atrophy noted with exam today. Elevated sodium level on prior labs.  PLAN  Pelvic US images and report reviewed with patient.  FU biopsy.  Instructions and precautions given.  She has an Rx for vaginal estrogen cream. Return for pap and repeat CMP in 2 weeks. May need to do IFOB.

## 2020-03-03 NOTE — Patient Instructions (Addendum)

## 2020-03-07 LAB — SURGICAL PATHOLOGY

## 2020-03-13 ENCOUNTER — Ambulatory Visit (HOSPITAL_COMMUNITY)
Admission: EM | Admit: 2020-03-13 | Discharge: 2020-03-13 | Disposition: A | Discharge status: Home / Self Care | Payer: BC Managed Care – PPO | Attending: Family Medicine | Admitting: Family Medicine

## 2020-03-13 ENCOUNTER — Other Ambulatory Visit: Payer: Self-pay

## 2020-03-13 ENCOUNTER — Encounter (HOSPITAL_COMMUNITY): Payer: Self-pay

## 2020-03-13 DIAGNOSIS — Z7982 Long term (current) use of aspirin: Secondary | ICD-10-CM | POA: Diagnosis not present

## 2020-03-13 DIAGNOSIS — R1011 Right upper quadrant pain: Secondary | ICD-10-CM | POA: Insufficient documentation

## 2020-03-13 DIAGNOSIS — K529 Noninfective gastroenteritis and colitis, unspecified: Secondary | ICD-10-CM | POA: Insufficient documentation

## 2020-03-13 DIAGNOSIS — Z79899 Other long term (current) drug therapy: Secondary | ICD-10-CM | POA: Insufficient documentation

## 2020-03-13 DIAGNOSIS — Z20822 Contact with and (suspected) exposure to covid-19: Secondary | ICD-10-CM | POA: Diagnosis not present

## 2020-03-13 MED ORDER — ONDANSETRON HCL 4 MG PO TABS: 4.0000 mg | ORAL_TABLET | Freq: Four times a day (QID) | ORAL | 0 refills | Status: DC

## 2020-03-13 NOTE — ED Provider Notes (Signed)
[facility]    CSN: [id number] Arrival date & time: 03/13/20  1655      History   Chief Complaint Chief Complaint  Patient presents with  . Abdominal Pain    HPI Angelica Warren is a 59 y.o. female.   HPI  Nausea vomiting and diarrhea starting today No cough cold or runny nose symptoms, she has had some fever and body aches Her abdominal pain is moderate and crampy She has been able to keep down some sips of water but she does not think its enough She has not any food that might be suspicious She has not been around anyone with illness She has had both of her Covid vaccinations   Past Medical History:  Diagnosis Date  . Chronic constipation     There are no problems to display for this patient.   Past Surgical History:  Procedure Laterality Date  . CLEFT PALATE REPAIR  infant  . COSMETIC SURGERY Left    hand    OB History    Gravida  0   Para      Term      Preterm      AB      Living        SAB      TAB      Ectopic      Multiple      Live Births               Home Medications    Prior to Admission medications   Medication Sig Start Date End Date Taking? Authorizing Provider  Ascorbic Acid (VITAMIN C) 100 MG tablet Take 100 mg by mouth daily.    [provider]  aspirin 325 MG buffered tablet Take 325 mg by mouth as needed.     [provider]  B Complex Vitamins (VITAMIN B COMPLEX PO) Take 1 capsule by mouth daily.    [provider]  Calcium-Magnesium-Vitamin D (CALCIUM MAGNESIUM PO) Take by mouth. 1-2 caps daily    [provider]  Digestive Enzymes (SIMILASE PO) Take by mouth.    [provider]  estradiol (ESTRACE) 0.1 MG/GM vaginal cream Use 1/2 g vaginally every night for the first 2 weeks, then use 1/2 g vaginally at bed time twice a week. 02/29/20   Nunzio Cobbs, MD  fish oil-omega-3 fatty acids 1000 MG capsule Take 2 g by mouth daily.    [provider]  Ginkgo Biloba 40 MG TABS Take 1 tablet by mouth daily.    [provider]  GLUCOSAMINE-CHONDROITIN PO Take 2 tablets by mouth daily.    [provider]  Misc Natural Products (SUPER GREENS) POWD Take by mouth.    [provider]  Multiple Vitamin (MULTIVITAMIN) tablet Take 1 tablet by mouth daily.    [provider]  Multiple Vitamins-Minerals (PRESERVISION AREDS 2 PO) Take 1 capsule by mouth 2 (two) times daily.    [provider]  ondansetron (ZOFRAN) 4 MG tablet Take 1-2 tablets (4-8 mg total) by mouth every 6 (six) hours. As needed nausea 03/13/20   Raylene Everts, MD  OVER THE COUNTER MEDICATION daily. Liver Support 1-2 capsules    [provider]  PROBIOTIC CAPS Take 1 capsule by mouth daily.    [provider]  TURMERIC PO Take 1 tablet by mouth 2 (two) times daily.    [provider]    Family History Family History  Problem Relation Age of Onset  . Throat cancer Father        throat cancer  . Thyroid disease Father   . Hypertension Father   . Hyperlipidemia Father   . Thyroid disease Maternal Grandmother   . Osteoporosis Maternal Grandmother   . Heart disease Maternal Grandfather   . Cancer Paternal Grandmother   . Dementia Mother   . Thyroid disease Maternal Aunt   . Osteoporosis Maternal Aunt   . Lung cancer Maternal Uncle        lung cancer  . Heart disease Maternal Uncle   . Lung cancer Paternal Uncle        lung cancer  . Breast cancer Neg Hx     Social History Social History   Tobacco Use  . Smoking status: Never Smoker  . Smokeless tobacco: Never Used  Vaping Use  . Vaping Use: Never used  Substance Use Topics  . Alcohol use: No    Alcohol/week: 0.0 standard drinks  . Drug use: No     Allergies   Penicillins, Augmentin [amoxicillin-pot clavulanate], Dairy aid [lactase], and Eggs or egg-derived products   Review of Systems Review of Systems See  HPI  Physical Exam Triage Vital Signs ED Triage Vitals  Enc Vitals Group     BP 03/13/20 1746 127/69     Pulse Rate 03/13/20 1746 84     Resp 03/13/20 1746 18     Temp 03/13/20 1746 99.9 F (37.7 C)     Temp Source 03/13/20 1746 Oral     SpO2 03/13/20 1746 97 %     Weight 03/13/20 1747 129 lb (58.5 kg)     Height 03/13/20 1747 5\' 5"  (1.651 m)     Head Circumference --      Peak Flow --      Pain Score 03/13/20 1747 4     Pain Loc --      Pain Edu? --      Excl. in [place]? --    No data found.  Updated Vital Signs BP 127/69   Pulse 84   Temp 99.9 F (37.7 C) (Oral)   Resp 18   Ht 5\' 5"  (1.651 m)   Wt 58.5 kg   LMP 11/28/2014 Comment: Spotting  SpO2 97%   BMI 21.47 kg/m     Physical Exam Constitutional:      General: She is not in acute distress.    Appearance: She is well-developed and normal weight.  HENT:     Head: Normocephalic and atraumatic.     Mouth/Throat:     Comments: Mask is in place Eyes:     Conjunctiva/sclera: Conjunctivae normal.     Pupils: Pupils are equal, round, and reactive to light.  Cardiovascular:     Rate and Rhythm: Normal rate.     Heart sounds: Normal heart sounds.  Pulmonary:     Effort: Pulmonary effort is normal. No respiratory distress.     Breath sounds: Normal breath sounds.  Abdominal:     General: Bowel sounds are normal. There is no distension.     Palpations: Abdomen is soft.     Tenderness: There is abdominal tenderness.     Comments: Mild tenderness to deep palpation across the upper abdomen.  No guarding or rebound.  No organomegaly  Musculoskeletal:        General: Normal range of motion.     Cervical back: Normal range of motion.  Skin:    General: Skin  is warm and dry.  Neurological:     Mental Status: She is alert.  Psychiatric:        Behavior: Behavior normal.      UC Treatments / Results  Labs (all labs ordered are listed, but only abnormal results are displayed) Labs Reviewed  SARS CORONAVIRUS 2  (TAT 6-24 HRS)    EKG   Radiology No results found.  Procedures Procedures (including critical care time)  Medications Ordered in UC Medications - No data to display  Initial Impression / Assessment and Plan / UC Course  I have reviewed the triage vital signs and the nursing notes.  Pertinent labs & imaging results that were available during my care of the patient were reviewed by me and considered in my medical decision making (see chart for details).     Reviewed gastroenteritis.  Rehydration. Final Clinical Impressions(s) / UC Diagnoses   Final diagnoses:  Gastroenteritis     Discharge Instructions     Drink plenty of fluids Add bland diet as tolerated Take Zofran if needed for nausea and vomiting Call your primary care if not improving in a couple days   ED Prescriptions    Medication Sig Dispense Auth. Provider   ondansetron (ZOFRAN) 4 MG tablet Take 1-2 tablets (4-8 mg total) by mouth every 6 (six) hours. As needed nausea 12 tablet Raylene Everts, MD     PDMP not reviewed this encounter.   Raylene Everts, MD 03/13/20 Tresa Moore

## 2020-03-13 NOTE — Discharge Instructions (Signed)
Drink plenty of fluids Add bland diet as tolerated Take Zofran if needed for nausea and vomiting Call your primary care if not improving in a couple days

## 2020-03-13 NOTE — ED Triage Notes (Signed)
Pt c/o 4/10 cramping pain in RUQ, LUQ, fever, nausea, diarrhea, body aches started yesterday.

## 2020-03-14 LAB — SARS CORONAVIRUS 2 (TAT 6-24 HRS): SARS Coronavirus 2: NEGATIVE

## 2020-03-16 ENCOUNTER — Telehealth: Payer: Self-pay

## 2020-03-16 NOTE — Telephone Encounter (Signed)
Patient called to reschedule office visit due to not feeling well. No available appointments, need triage to assist.

## 2020-03-16 NOTE — Telephone Encounter (Signed)
Spoke with pt. Pt states cancelled appt with Dr Quincy Simmonds on 03/21/20 due to not feeling well. Pt states had negative Covid test, but doesn't want to chance anything. Pt rescheduled as work-in appt on 9/1 at 1145 am due to no earlier available appts.  Pt agreeable to date and time of appt.  Encounter closed.

## 2020-03-17 ENCOUNTER — Ambulatory Visit: Payer: BC Managed Care – PPO | Admitting: Obstetrics and Gynecology

## 2020-03-18 ENCOUNTER — Other Ambulatory Visit: Payer: Self-pay | Admitting: Family Medicine

## 2020-03-18 DIAGNOSIS — R1011 Right upper quadrant pain: Secondary | ICD-10-CM

## 2020-03-21 ENCOUNTER — Ambulatory Visit: Payer: BC Managed Care – PPO | Admitting: Obstetrics and Gynecology

## 2020-03-25 ENCOUNTER — Telehealth: Payer: Self-pay

## 2020-03-25 NOTE — Telephone Encounter (Signed)
Patient called to cancel office visit for recheck due to insurance and other health issues. Patient stated she will call back when she feels she is ready to reschedule.

## 2020-03-25 NOTE — Telephone Encounter (Signed)
Pt cancelled 2 week follow up for pap and repeat CMP. Encounter closed

## 2020-03-28 ENCOUNTER — Ambulatory Visit
Admission: RE | Admit: 2020-03-28 | Discharge: 2020-03-28 | Disposition: A | Discharge status: Home / Self Care | Payer: BC Managed Care – PPO | Source: Ambulatory Visit | Attending: Family Medicine | Admitting: Family Medicine

## 2020-03-28 ENCOUNTER — Other Ambulatory Visit: Payer: BC Managed Care – PPO

## 2020-03-28 DIAGNOSIS — R1011 Right upper quadrant pain: Secondary | ICD-10-CM

## 2020-03-30 ENCOUNTER — Ambulatory Visit: Payer: BC Managed Care – PPO | Admitting: Obstetrics and Gynecology

## 2021-02-22 NOTE — Progress Notes (Signed)
60 y.o. G0P0 Married Caucasian female here for annual exam.    No more bleeding or spotting.   Likes to do [employer].   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   Received her Covid booster #2.   PCP:   Horald Pollen, MD  Patient's last menstrual period was 11/28/2014.           Sexually active:  not often The current method of family planning is post menopausal status.    Exercising: gardening.  Smoker:  no  Health Maintenance: Pap:  02/20/18 Neg:Neg HR HPV, 02-25-15 Neg:Neg HR HPV, 02-19-12 Neg History of abnormal Pap:  no MMG:  02-17-20 3D/Neg/BiRads1 Colonoscopy:   12/2012 normal;next due 2024 BMD:   NA. TDaP:  02-20-18 Gardasil:   no HIV: 02-16-16 NR Hep C: 02-16-16 Neg Screening Labs:  PCP.   reports that she has never smoked. She has never used smokeless tobacco. She reports that she does not drink alcohol and does not use drugs.  Past Medical History:  Diagnosis Date   Chronic constipation     Past Surgical History:  Procedure Laterality Date   CLEFT PALATE REPAIR  infant   COSMETIC SURGERY Left    hand    Current Outpatient Medications  Medication Sig Dispense Refill   Ascorbic Acid (VITAMIN C) 100 MG tablet Take 100 mg by mouth daily.     aspirin 325 MG buffered tablet Take 325 mg by mouth as needed.      B Complex Vitamins (VITAMIN B COMPLEX PO) Take 1 capsule by mouth daily.     Calcium-Magnesium-Vitamin D (CALCIUM MAGNESIUM PO) Take by mouth. 1-2 caps daily     Digestive Enzymes (SIMILASE PO) Take by mouth.     fish oil-omega-3 fatty acids 1000 MG capsule Take 2 g by mouth daily.     Ginkgo Biloba 40 MG TABS Take 1 tablet by mouth daily.     GLUCOSAMINE-CHONDROITIN PO Take 2 tablets by mouth daily.     Misc Natural Products (SUPER GREENS) POWD Take by mouth.     Multiple Vitamin (MULTIVITAMIN) tablet Take 1 tablet by mouth daily.     Multiple Vitamins-Minerals (PRESERVISION AREDS 2 PO) Take 1 capsule by mouth 2 (two) times daily.     OVER THE COUNTER  MEDICATION daily. Liver Support 1-2 capsules     PROBIOTIC CAPS Take 1 capsule by mouth daily.     TURMERIC PO Take 1 tablet by mouth 2 (two) times daily.     estradiol (ESTRACE) 0.1 MG/GM vaginal cream Use 1/2 g vaginally every night for the first 2 weeks, then use 1/2 g vaginally at bed time twice a week. (Patient not taking: Reported on 03/01/2021) 42.5 g 1   ondansetron (ZOFRAN) 4 MG tablet Take 1-2 tablets (4-8 mg total) by mouth every 6 (six) hours. As needed nausea (Patient not taking: Reported on 03/01/2021) 12 tablet 0   No current facility-administered medications for this visit.    Family History  Problem Relation Age of Onset   Throat cancer Father        throat cancer   Thyroid disease Father    Hypertension Father    Hyperlipidemia Father    Thyroid disease Maternal Grandmother    Osteoporosis Maternal Grandmother    Heart disease Maternal Grandfather    Cancer Paternal Grandmother    Dementia Mother    Thyroid disease Maternal Aunt    Osteoporosis Maternal Aunt    Lung cancer Maternal Uncle  lung cancer   Heart disease Maternal Uncle    Lung cancer Paternal Uncle        lung cancer   Breast cancer Neg Hx     Review of Systems  All other systems reviewed and are negative.  Exam:   BP 130/72 (BP Location: Right Arm)   Pulse 88   Resp 20   Ht 5' 5.25" (1.657 m)   Wt 133 lb 3.2 oz (60.4 kg)   LMP 11/28/2014 Comment: Spotting  BMI 22.00 kg/m     General appearance: alert, cooperative and appears stated age Head: normocephalic, without obvious abnormality, atraumatic Neck: no adenopathy, supple, symmetrical, trachea midline and thyroid normal to inspection and palpation Lungs: clear to auscultation bilaterally Breasts: normal appearance, no masses or tenderness, No nipple retraction or dimpling, No nipple discharge or bleeding, No axillary adenopathy Heart: regular rate and rhythm Abdomen: soft, non-tender; no masses, no organomegaly Extremities:  extremities normal, atraumatic, no cyanosis or edema Skin: skin color, texture, turgor normal. No rashes or lesions Lymph nodes: cervical, supraclavicular, and axillary nodes normal. Neurologic: grossly normal  Pelvic: External genitalia:  no lesions              No abnormal inguinal nodes palpated.              Urethra:  normal appearing urethra with no masses, tenderness or lesions              Bartholins and Skenes: normal                 Vagina: normal appearing vagina with normal color and discharge, no lesions.  Almost second degree rectocele.  Good uterine and bladder support.              Cervix: no lesions              Pap taken: no Bimanual Exam:  Uterus:  normal size, contour, position, consistency, mobility, non-tender              Adnexa: no mass, fullness, tenderness              Rectal exam: yes..  Confirms.              Anus:  normal sphincter tone, no lesions  Chaperone was present for exam:  Sharee Pimple, Therapist, sports.   Assessment:   Well woman visit with gynecologic exam. Postmenopausal bleeding.  Resolved.  Vaginal atrophy.  Rectocele and uterine prolapse.  Stable.   Hx stress incontinence. Caregiver status.   Plan: Mammogram screening discussed. Self breast awareness reviewed. Pap and HR HPV 2024.  Guidelines for Calcium, Vitamin D, regular exercise program including cardiovascular and weight bearing exercise. Kegels. She will let me know if she wishes to do any additional therapy for prolapse or incontinence.  Refill of vaginal estrogen.  I discussed potential effect on breast cancer.  Follow up annually and prn.   After visit summary provided.

## 2021-03-01 ENCOUNTER — Encounter: Payer: Self-pay | Admitting: Obstetrics and Gynecology

## 2021-03-01 ENCOUNTER — Other Ambulatory Visit: Payer: Self-pay

## 2021-03-01 ENCOUNTER — Ambulatory Visit (INDEPENDENT_AMBULATORY_CARE_PROVIDER_SITE_OTHER): Payer: BC Managed Care – PPO | Admitting: Obstetrics and Gynecology

## 2021-03-01 VITALS — BP 130/72 | HR 88 | Resp 20 | Ht 65.25 in | Wt 133.2 lb

## 2021-03-01 DIAGNOSIS — Z01419 Encounter for gynecological examination (general) (routine) without abnormal findings: Secondary | ICD-10-CM | POA: Diagnosis not present

## 2021-03-01 MED ORDER — ESTRADIOL 0.1 MG/GM VA CREA: TOPICAL_CREAM | VAGINAL | 1 refills | Status: AC

## 2021-03-01 NOTE — Patient Instructions (Signed)

## 2022-02-28 NOTE — Progress Notes (Signed)
61 y.o. G0P0 Married Caucasian female here for annual exam.    Not using vaginal estrogen.   Bowel movements are going well.  Has urinary incontinence but not as much as in the past.   Can leak with cough and laugh sometimes.  Up once to twice a night to void.  Some urgency to get to bathroom on time. Voiding well.  Wears a pad due to incontinence.   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   PCP:  Horald Pollen, MD   Patient's last menstrual period was 04/29/2014.           Sexually active: Yes.    The current method of family planning is post menopausal status.    Exercising: No.  The patient does not participate in regular exercise at present. Smoker:  no  Health Maintenance: Pap:  02/20/18 Neg:Neg HR HPV, 02-25-15 Neg:Neg HR HPV, 02-19-12 Neg History of abnormal Pap:  no MMG:  02-23-20 Neg/BiRads1-- info to patient so she can call and schedule Colonoscopy: 12/2012 normal;next due 2024 BMD:   n/a  Result  n/a TDaP:  02-20-18 Gardasil:   no HIV: 02-16-16 NR Hep C:7-102-17 Neg Screening Labs:   PCP.   reports that she has never smoked. She has never used smokeless tobacco. She reports that she does not drink alcohol and does not use drugs.  Past Medical History:  Diagnosis Date   Chronic constipation     Past Surgical History:  Procedure Laterality Date   CLEFT PALATE REPAIR  infant   COSMETIC SURGERY Left    hand    Current Outpatient Medications  Medication Sig Dispense Refill   Ascorbic Acid (VITAMIN C) 100 MG tablet Take 100 mg by mouth daily.     aspirin 325 MG buffered tablet Take 325 mg by mouth as needed.      B Complex Vitamins (VITAMIN B COMPLEX PO) Take 1 capsule by mouth daily.     Calcium-Magnesium-Vitamin D (CALCIUM MAGNESIUM PO) Take by mouth. 1-2 caps daily     Digestive Enzymes (SIMILASE PO) Take by mouth.     fish oil-omega-3 fatty acids 1000 MG capsule Take 2 g by mouth daily.     fluticasone (FLONASE) 50 MCG/ACT nasal spray Place into the nose.      GLUCOSAMINE-CHONDROITIN PO Take 2 tablets by mouth daily.     MILK THISTLE EXTRACT PO Take by mouth.     Misc Natural Products (SUPER GREENS) POWD Take by mouth.     Multiple Vitamin (MULTIVITAMIN) tablet Take 1 tablet by mouth daily.     Multiple Vitamins-Minerals (PRESERVISION AREDS 2 PO) Take 1 capsule by mouth 2 (two) times daily.     OVER THE COUNTER MEDICATION daily. Liver Support 1-2 capsules     PROBIOTIC CAPS Take 1 capsule by mouth daily.     saccharomyces boulardii (FLORASTOR) 250 MG capsule Take by mouth.     TURMERIC PO Take 1 tablet by mouth 2 (two) times daily.     estradiol (ESTRACE) 0.1 MG/GM vaginal cream Use 1/2 g vaginally every night for the first 2 weeks, then use 1/2 g vaginally at bed time twice a week. (Patient not taking: Reported on 03/05/2022) 42.5 g 1   No current facility-administered medications for this visit.    Family History  Problem Relation Age of Onset   Throat cancer Father        throat cancer   Thyroid disease Father    Hypertension Father    Hyperlipidemia  Father    Thyroid disease Maternal Grandmother    Osteoporosis Maternal Grandmother    Heart disease Maternal Grandfather    Cancer Paternal Grandmother    Dementia Mother    Thyroid disease Maternal Aunt    Osteoporosis Maternal Aunt    Lung cancer Maternal Uncle        lung cancer   Heart disease Maternal Uncle    Lung cancer Paternal Uncle        lung cancer   Breast cancer Neg Hx     Review of Systems  All other systems reviewed and are negative.   Exam:   BP 118/60   Pulse 64   Resp 14   Ht '5\' 5"'$  (1.651 m)   Wt 125 lb (56.7 kg)   LMP 04/29/2014 Comment: pt is unsure when LMP.   BMI 20.80 kg/m     General appearance: alert, cooperative and appears stated age Head: normocephalic, without obvious abnormality, atraumatic Neck: no adenopathy, supple, symmetrical, trachea midline and thyroid normal to inspection and palpation Lungs: clear to auscultation  bilaterally Breasts: normal appearance, no masses or tenderness, No nipple retraction or dimpling, No nipple discharge or bleeding, No axillary adenopathy Heart: regular rate and rhythm Abdomen: soft, non-tender; no masses, no organomegaly Extremities: extremities normal, atraumatic, no cyanosis or edema Skin: skin color, texture, turgor normal. No rashes or lesions Lymph nodes: cervical, supraclavicular, and axillary nodes normal. Neurologic: grossly normal  Pelvic: External genitalia:  no lesions              No abnormal inguinal nodes palpated.              Urethra:  normal appearing urethra with no masses, tenderness or lesions              Bartholins and Skenes: normal                 Vagina: normal appearing vagina with normal color and discharge, no lesions.   Good anterior vaginal wall and apical support.  Second degree rectocele with valsalva.              Cervix: no lesions              Pap taken: no Bimanual Exam:  Uterus:  normal size, contour, position, consistency, mobility, non-tender              Adnexa: no mass, fullness, tenderness              Rectal exam: yes.  Confirms.              Anus:  normal sphincter tone, no lesions  Chaperone was present for exam:  Estill Bamberg, CMA  Assessment:   Well woman visit with gynecologic exam. Vaginal atrophy.  Second degree rectocele.  Stable. Hx stress incontinence. Caregiver status.   Plan: Mammogram screening discussed.  She will schedule.  Self breast awareness reviewed. Pap and HR HPV as above. Guidelines for Calcium, Vitamin D, regular exercise program including cardiovascular and weight bearing exercise. After her mammogram is complete and normal, she may consider restarting the estrogen cream.  We discussed potential effect on breast cancer.  We discussed risk factors for rectocele.  Bladder irritants reviewed.  She declines medication for overactive bladder.  Follow up annually and prn.   After visit summary  provided.

## 2022-03-05 ENCOUNTER — Other Ambulatory Visit: Payer: Self-pay | Admitting: Obstetrics and Gynecology

## 2022-03-05 ENCOUNTER — Ambulatory Visit (INDEPENDENT_AMBULATORY_CARE_PROVIDER_SITE_OTHER): Payer: BC Managed Care – PPO | Admitting: Obstetrics and Gynecology

## 2022-03-05 ENCOUNTER — Ambulatory Visit
Admission: RE | Admit: 2022-03-05 | Discharge: 2022-03-05 | Disposition: A | Discharge status: Home / Self Care | Payer: BC Managed Care – PPO | Source: Ambulatory Visit | Attending: Obstetrics and Gynecology | Admitting: Obstetrics and Gynecology

## 2022-03-05 ENCOUNTER — Encounter: Payer: Self-pay | Admitting: Obstetrics and Gynecology

## 2022-03-05 VITALS — BP 118/60 | HR 64 | Resp 14 | Ht 65.0 in | Wt 125.0 lb

## 2022-03-05 DIAGNOSIS — Z01419 Encounter for gynecological examination (general) (routine) without abnormal findings: Secondary | ICD-10-CM

## 2022-03-05 DIAGNOSIS — Z1231 Encounter for screening mammogram for malignant neoplasm of breast: Secondary | ICD-10-CM

## 2022-03-05 NOTE — Patient Instructions (Signed)

## 2022-03-25 IMAGING — MG DIGITAL SCREENING BILAT W/ TOMO W/ CAD
8 series · 9 of 24 positions shown · non-contrast
Comparison: Previous exam(s).

CLINICAL DATA: Screening.

EXAM:
DIGITAL SCREENING BILATERAL MAMMOGRAM WITH TOMO AND CAD

[L MLO synth-2D]
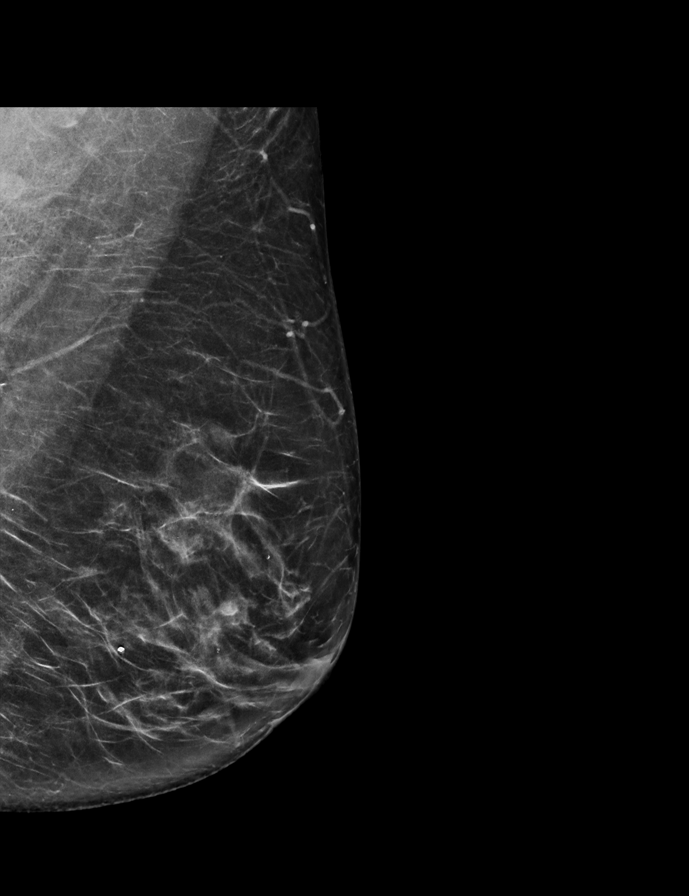

[R CC synth-2D]
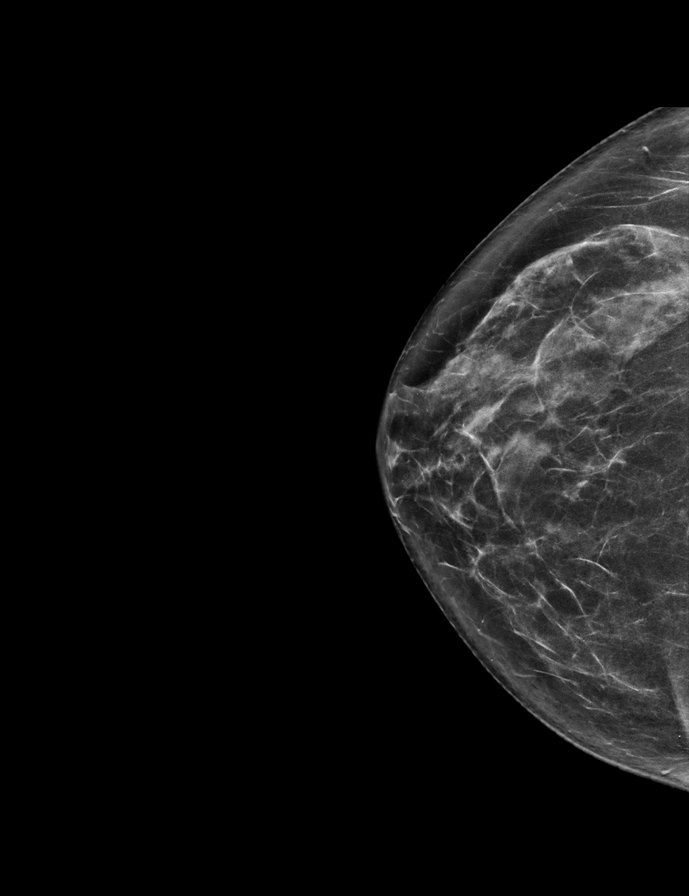

[L CC synth-2D]
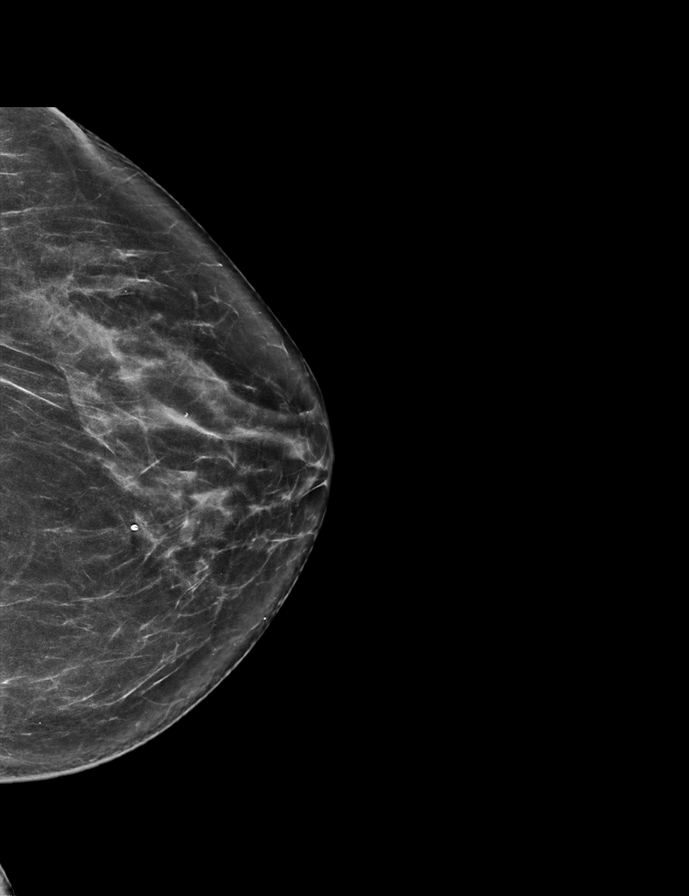

[R MLO synth-2D]
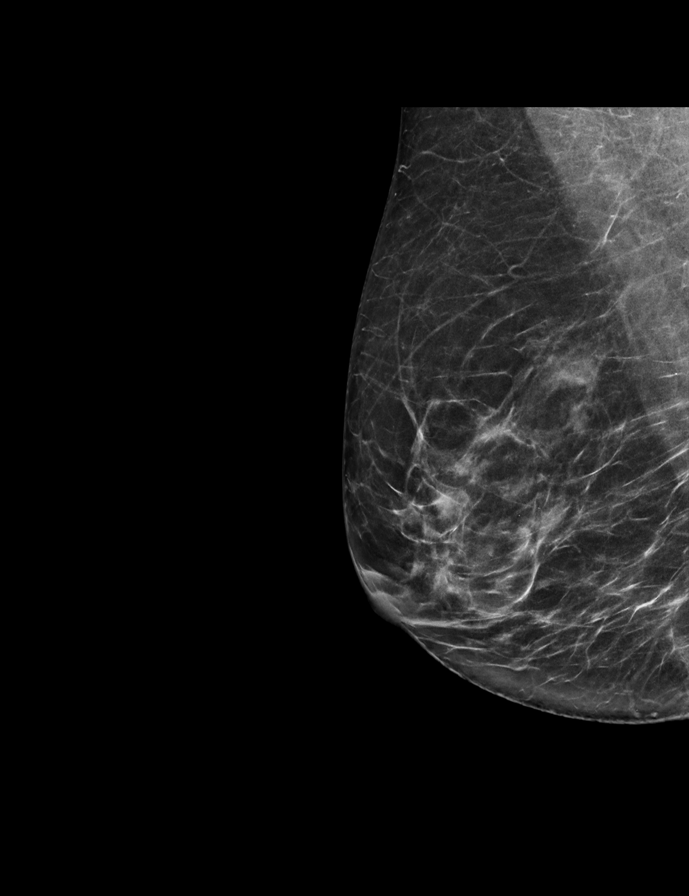

[L MLO tomo · 2 of 70 frames shown]
[frame 23/70]
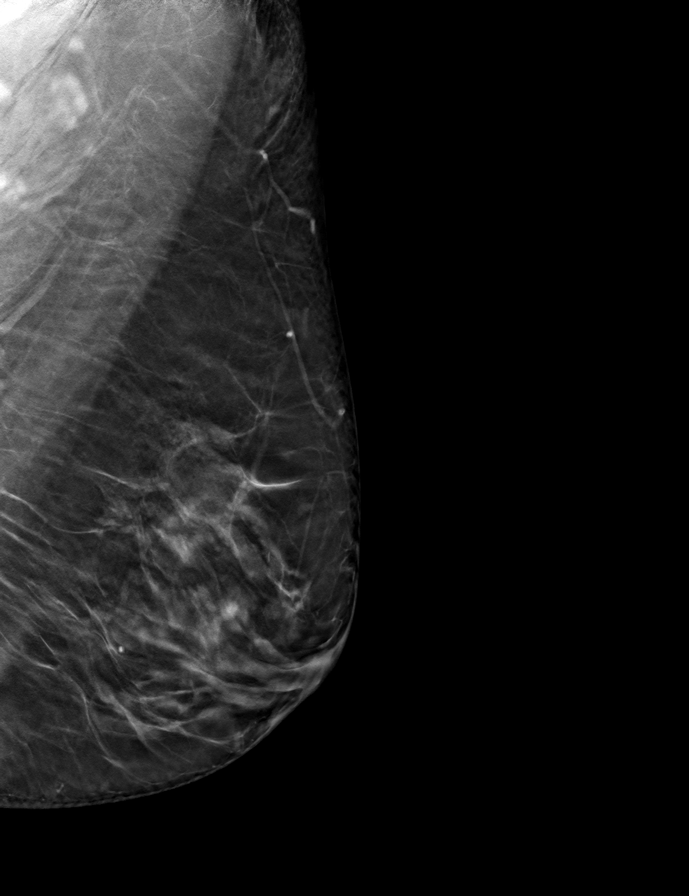
[frame 35/70]
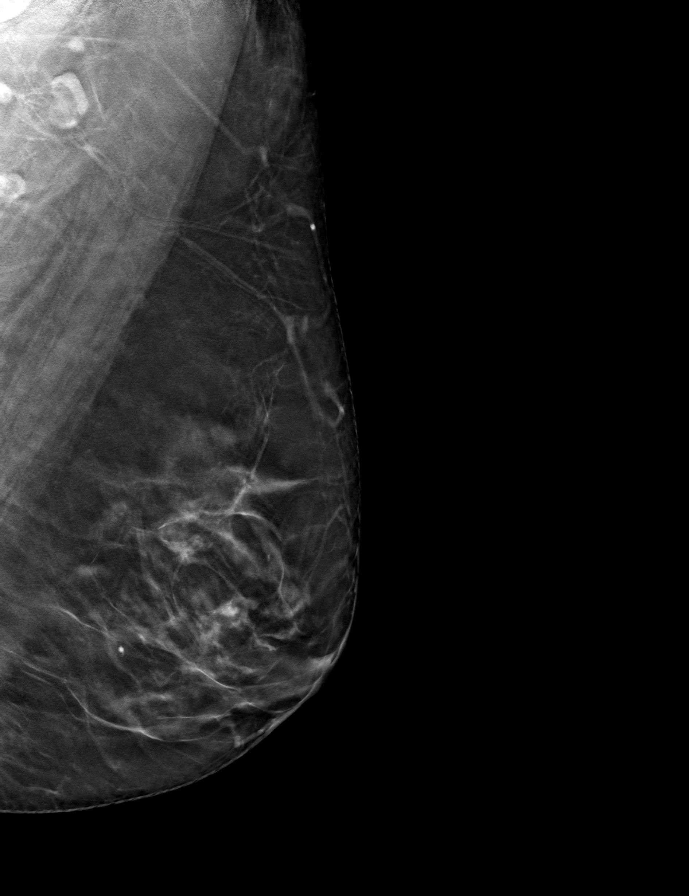

[R CC tomo · tomo slice 33/64.0]
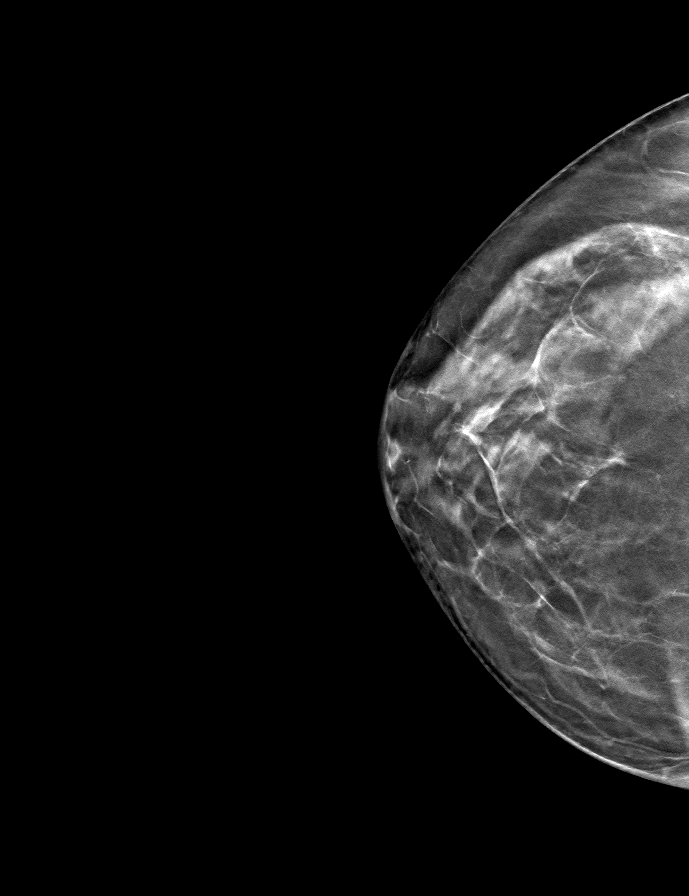

[R MLO tomo · tomo slice 33/65.0]
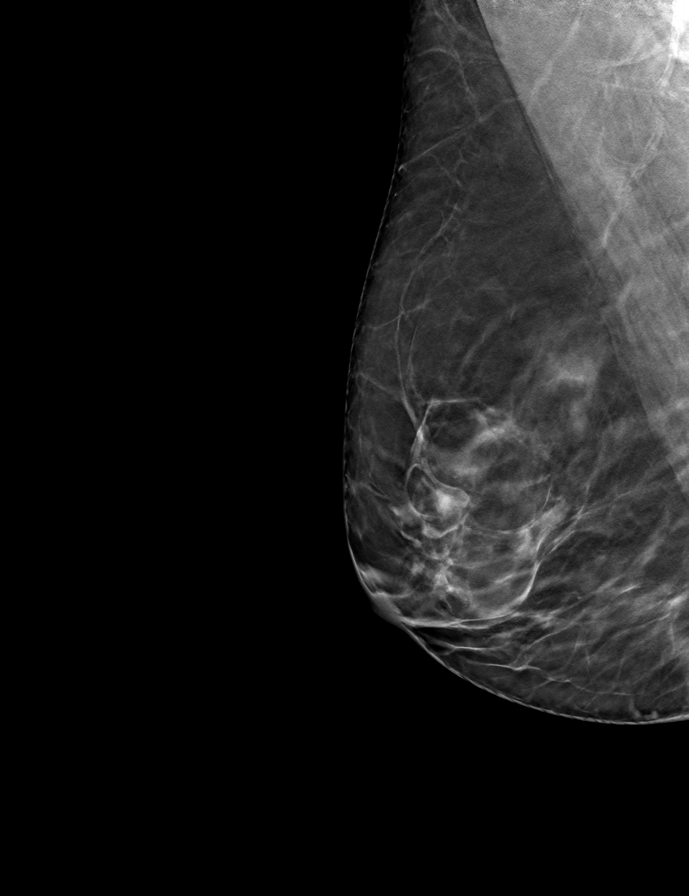

[L CC tomo · tomo slice 34/67.0]
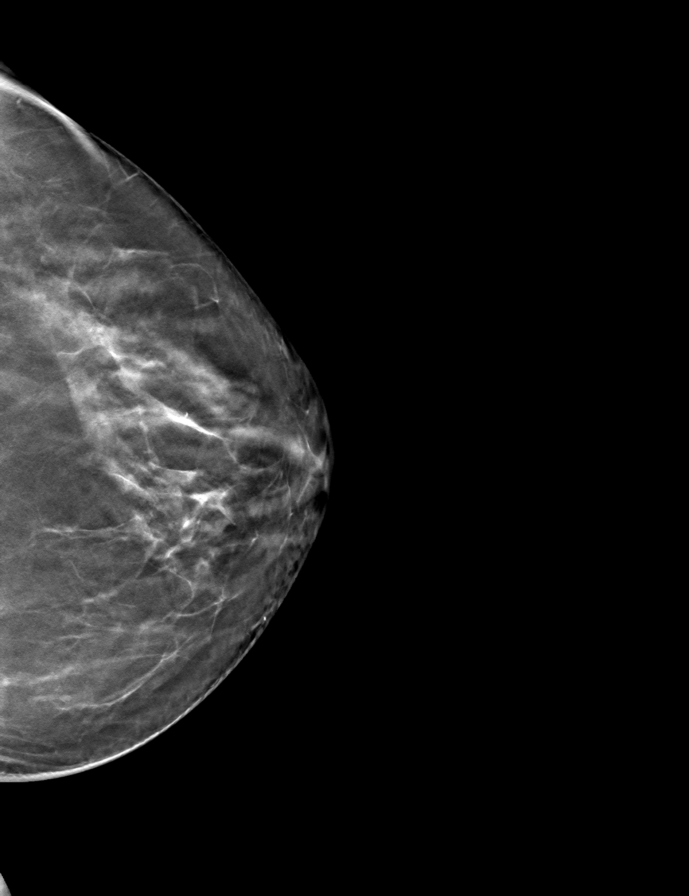

[9 of 24 positions shown; findings below may reference images not displayed]

ACR Breast Density Category b: There are scattered areas of
fibroglandular density.
FINDINGS: There are no findings suspicious for malignancy. Images were
processed with CAD.
IMPRESSION: No mammographic evidence of malignancy. A result letter of this
screening mammogram will be mailed directly to the patient.

RECOMMENDATION:
Screening mammogram in one year. (Code:[id number]-775)

BI-RADS CATEGORY  1: Negative.

## 2022-05-22 ENCOUNTER — Ambulatory Visit: Payer: Self-pay

## 2022-07-30 DIAGNOSIS — R7309 Other abnormal glucose: Secondary | ICD-10-CM

## 2022-07-30 HISTORY — DX: Other abnormal glucose: R73.09

## 2023-03-18 ENCOUNTER — Ambulatory Visit: Payer: BC Managed Care – PPO | Admitting: Obstetrics and Gynecology

## 2023-04-09 NOTE — Progress Notes (Signed)
62 y.o. G0P0 Married Caucasian female here for annual exam.  She is still having hot flashes. She says that she can be very thirsty. She is also having bloating.  Some fatigue.  Bloating some of the time.  Constipation can vary.  Has rectocele.   No vaginal bleeding.  Maternal cousin dx with ovarian cancer.   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 She will be moving into memory care.   PCP:   Nadyne Coombes MD  Patient's last menstrual period was 04/29/2014.           Sexually active: Yes.    The current method of family planning is post menopausal status.    Exercising: No.  The patient does not participate in regular exercise at present. Smoker:  no  Health Maintenance: Pap:  02/20/18 neg: HR HPV neg History of abnormal Pap:  no MMG:  03/05/22 Breast Density Cat C, BI-RADS CAT 1 neg.  She is aware she is due.  Colonoscopy: 2024 - normal.  Due in 10 years.  BMD:   n/a  Result  n/a TDaP:  02/20/18 Gardasil:   no HIV: 02/16/16 NR Hep C: 02/16/16 neg Screening Labs:  Hb today: no, Urine today: none    reports that she has never smoked. She has never used smokeless tobacco. She reports that she does not drink alcohol and does not use drugs.  Past Medical History:  Diagnosis Date   Chronic constipation     Past Surgical History:  Procedure Laterality Date   CLEFT PALATE REPAIR  infant   COSMETIC SURGERY Left    hand    Current Outpatient Medications  Medication Sig Dispense Refill   Ascorbic Acid (VITAMIN C) 100 MG tablet Take 100 mg by mouth daily.     aspirin 325 MG buffered tablet Take 325 mg by mouth as needed.      B Complex Vitamins (VITAMIN B COMPLEX PO) Take 1 capsule by mouth daily.     B Complex Vitamins (VITAMIN B-COMPLEX) TABS Take 1 tablet by mouth daily.     Calcium-Magnesium-Vitamin D (CALCIUM MAGNESIUM PO) Take by mouth. 1-2 caps daily     Digestive Enzymes (SIMILASE PO) Take by mouth.     fish oil-omega-3 fatty acids 1000 MG capsule Take 2 g by mouth daily.      fluticasone (FLONASE) 50 MCG/ACT nasal spray Place into the nose.     GLUCOSAMINE-CHONDROITIN PO Take 2 tablets by mouth daily.     MAGNESIUM PO Take by mouth.     MILK THISTLE EXTRACT PO Take by mouth.     Misc Natural Products (SUPER GREENS) POWD Take by mouth.     Multiple Vitamin (MULTIVITAMIN) tablet Take 1 tablet by mouth daily.     Multiple Vitamins-Minerals (PRESERVISION AREDS 2 PO) Take 1 capsule by mouth 2 (two) times daily.     OVER THE COUNTER MEDICATION daily. Liver Support 1-2 capsules     PROBIOTIC CAPS Take 1 capsule by mouth daily.     TURMERIC PO Take 1 tablet by mouth 2 (two) times daily.     ELDERBERRY PO Take by mouth.     estradiol (ESTRACE) 0.1 MG/GM vaginal cream Use 1/2 g vaginally every night for the first 2 weeks, then use 1/2 g vaginally at bed time twice a week. (Patient not taking: Reported on 03/05/2022) 42.5 g 1   No current facility-administered medications for this visit.    Family History  Problem Relation Age of Onset   Throat cancer Father  throat cancer   Thyroid disease Father    Hypertension Father    Hyperlipidemia Father    Thyroid disease Maternal Grandmother    Osteoporosis Maternal Grandmother    Heart disease Maternal Grandfather    Cancer Paternal Grandmother    Dementia Mother    Thyroid disease Maternal Aunt    Osteoporosis Maternal Aunt    Lung cancer Maternal Uncle        lung cancer   Heart disease Maternal Uncle    Lung cancer Paternal Uncle        lung cancer   Breast cancer Neg Hx     Review of Systems  All other systems reviewed and are negative.   Exam:   BP 110/66   Pulse 77   Ht 5' 4.57" (1.64 m)   Wt 138 lb 6.4 oz (62.8 kg)   LMP 04/29/2014 Comment: pt is unsure when LMP.   SpO2 100%   BMI 23.34 kg/m     General appearance: alert, cooperative and appears stated age Head: normocephalic, without obvious abnormality, atraumatic Neck: no adenopathy, supple, symmetrical, trachea midline and thyroid normal  to inspection and palpation Lungs: clear to auscultation bilaterally Breasts: normal appearance, no masses or tenderness, No nipple retraction or dimpling, No nipple discharge or bleeding, No axillary adenopathy Heart: regular rate and rhythm Abdomen: soft, non-tender; no masses, no organomegaly Extremities: extremities normal, atraumatic, no cyanosis or edema Skin: skin color, texture, turgor normal. No rashes or lesions Lymph nodes: cervical, supraclavicular, and axillary nodes normal. Neurologic: grossly normal  Pelvic: External genitalia:  no lesions              No abnormal inguinal nodes palpated.              Urethra:  normal appearing urethra with no masses, tenderness or lesions              Bartholins and Skenes: normal                 Vagina: normal appearing vagina with normal color and discharge, no lesions              Cervix: no lesions              Pap taken: yes Bimanual Exam:  Uterus:  normal size, contour, position, consistency, mobility, non-tender              Adnexa: no mass, fullness, tenderness              Rectal exam: yes.  Confirms.  First degree rectocele.               Anus:  normal sphincter tone, no lesions  Chaperone was present for exam:  Gwenith Spitz, CMA  Assessment:   Well woman visit with gynecologic exam. Vaginal atrophy.  Cervical cancer screening.  First degree rectocele.    Hx stress incontinence. Caregiver status.  Abdominal bloating.  Fatigue.  Hot flashes.  Polydipsia.  Plan: Mammogram screening discussed. Self breast awareness reviewed. Pap and HR HPV collected. Guidelines for Calcium, Vitamin D, regular exercise program including cardiovascular and weight bearing exercise. TSH, CBC, A1C.  Will order pelvic US. Follow up annually and prn.

## 2023-04-23 ENCOUNTER — Telehealth: Payer: Self-pay | Admitting: Obstetrics and Gynecology

## 2023-04-23 ENCOUNTER — Encounter: Payer: Self-pay | Admitting: Obstetrics and Gynecology

## 2023-04-23 ENCOUNTER — Ambulatory Visit: Payer: BC Managed Care – PPO | Admitting: Obstetrics and Gynecology

## 2023-04-23 VITALS — BP 110/66 | HR 77 | Ht 64.57 in | Wt 138.4 lb

## 2023-04-23 DIAGNOSIS — R5383 Other fatigue: Secondary | ICD-10-CM

## 2023-04-23 DIAGNOSIS — Z01419 Encounter for gynecological examination (general) (routine) without abnormal findings: Secondary | ICD-10-CM

## 2023-04-23 DIAGNOSIS — Z124 Encounter for screening for malignant neoplasm of cervix: Secondary | ICD-10-CM

## 2023-04-23 DIAGNOSIS — R140 Abdominal distension (gaseous): Secondary | ICD-10-CM

## 2023-04-23 DIAGNOSIS — R631 Polydipsia: Secondary | ICD-10-CM | POA: Diagnosis not present

## 2023-04-23 DIAGNOSIS — R232 Flushing: Secondary | ICD-10-CM

## 2023-04-23 NOTE — Telephone Encounter (Signed)
Please schedule pelvic ultrasound for my patient.  Diagnosis: abdominal bloating.

## 2023-04-23 NOTE — Patient Instructions (Signed)

## 2023-04-23 NOTE — Telephone Encounter (Signed)
PUS order placed.

## 2023-04-24 ENCOUNTER — Other Ambulatory Visit (HOSPITAL_COMMUNITY)
Admission: RE | Admit: 2023-04-24 | Discharge: 2023-04-24 | Disposition: A | Discharge status: Home / Self Care | Payer: BC Managed Care – PPO | Source: Ambulatory Visit | Attending: Obstetrics and Gynecology | Admitting: Obstetrics and Gynecology

## 2023-04-24 DIAGNOSIS — Z124 Encounter for screening for malignant neoplasm of cervix: Secondary | ICD-10-CM | POA: Insufficient documentation

## 2023-04-24 LAB — HEMOGLOBIN A1C
Hgb A1c MFr Bld: 5.8 % of total Hgb — ABNORMAL HIGH (ref <5.7)
Mean Plasma Glucose: 120 mg/dL
eAG (mmol/L): 6.6 mmol/L

## 2023-04-24 LAB — TSH: TSH: 0.8 mIU/L (ref 0.40–4.50)

## 2023-04-24 LAB — CBC
HCT: 36.7 % (ref 35.0–45.0)
Hemoglobin: 11.9 g/dL (ref 11.7–15.5)
MCH: 28.7 pg (ref 27.0–33.0)
MCHC: 32.4 g/dL (ref 32.0–36.0)
MCV: 88.6 fL (ref 80.0–100.0)
MPV: 10.1 fL (ref 7.5–12.5)
Platelets: 292 10*3/uL (ref 140–400)
RBC: 4.14 10*6/uL (ref 3.80–5.10)
RDW: 12.6 % (ref 11.0–15.0)
WBC: 7.3 10*3/uL (ref 3.8–10.8)

## 2023-04-24 NOTE — Telephone Encounter (Signed)
Per review of EPIC, patient is scheduled for PUS on 05/07/23.   Encounter closed.

## 2023-04-26 ENCOUNTER — Encounter: Payer: Self-pay | Admitting: Obstetrics and Gynecology

## 2023-04-29 LAB — CYTOLOGY - PAP
Comment: NEGATIVE
Diagnosis: NEGATIVE
High risk HPV: NEGATIVE

## 2023-05-07 ENCOUNTER — Ambulatory Visit
Admission: RE | Admit: 2023-05-07 | Discharge: 2023-05-07 | Disposition: A | Discharge status: Home / Self Care | Payer: BC Managed Care – PPO | Source: Ambulatory Visit | Attending: Obstetrics and Gynecology | Admitting: Obstetrics and Gynecology

## 2023-05-07 DIAGNOSIS — R140 Abdominal distension (gaseous): Secondary | ICD-10-CM

## 2023-12-09 ENCOUNTER — Other Ambulatory Visit: Payer: Self-pay | Admitting: Family Medicine

## 2023-12-09 DIAGNOSIS — Z1231 Encounter for screening mammogram for malignant neoplasm of breast: Secondary | ICD-10-CM

## 2023-12-11 ENCOUNTER — Other Ambulatory Visit: Payer: Self-pay | Admitting: Family Medicine

## 2023-12-11 DIAGNOSIS — K8689 Other specified diseases of pancreas: Secondary | ICD-10-CM

## 2023-12-16 ENCOUNTER — Ambulatory Visit
Admission: RE | Admit: 2023-12-16 | Discharge: 2023-12-16 | Disposition: A | Discharge status: Home / Self Care | Payer: Self-pay | Source: Ambulatory Visit | Attending: Family Medicine | Admitting: Family Medicine

## 2023-12-16 DIAGNOSIS — K8689 Other specified diseases of pancreas: Secondary | ICD-10-CM

## 2024-02-19 ENCOUNTER — Ambulatory Visit
Admission: RE | Admit: 2024-02-19 | Discharge: 2024-02-19 | Disposition: A | Discharge status: Home / Self Care | Payer: Self-pay | Source: Ambulatory Visit | Attending: Family Medicine | Admitting: Family Medicine

## 2024-02-19 DIAGNOSIS — Z1231 Encounter for screening mammogram for malignant neoplasm of breast: Secondary | ICD-10-CM

## 2024-02-24 ENCOUNTER — Other Ambulatory Visit: Payer: Self-pay | Admitting: Otolaryngology

## 2024-02-24 DIAGNOSIS — R1311 Dysphagia, oral phase: Secondary | ICD-10-CM

## 2024-03-26 ENCOUNTER — Other Ambulatory Visit

## 2024-04-23 ENCOUNTER — Ambulatory Visit: Payer: BC Managed Care – PPO | Admitting: Obstetrics and Gynecology
